# Patient Record
Sex: Female | Born: 1945
Health system: Southern US, Community
[De-identification: ages and names within clinical notes are randomized; demographics above are authoritative.]

## PROBLEM LIST (undated history)

## (undated) DIAGNOSIS — I10 Essential (primary) hypertension: Secondary | ICD-10-CM

---

## 2015-10-19 ENCOUNTER — Emergency Department (HOSPITAL_BASED_OUTPATIENT_CLINIC_OR_DEPARTMENT_OTHER)
Admission: EM | Admit: 2015-10-19 | Discharge: 2015-10-19 | Disposition: A | Discharge status: Home / Self Care | Payer: BLUE CROSS/BLUE SHIELD | Attending: Emergency Medicine | Admitting: Emergency Medicine

## 2015-10-19 ENCOUNTER — Emergency Department (HOSPITAL_BASED_OUTPATIENT_CLINIC_OR_DEPARTMENT_OTHER): Payer: BLUE CROSS/BLUE SHIELD

## 2015-10-19 ENCOUNTER — Encounter (HOSPITAL_BASED_OUTPATIENT_CLINIC_OR_DEPARTMENT_OTHER): Payer: Self-pay | Admitting: *Deleted

## 2015-10-19 DIAGNOSIS — Z79899 Other long term (current) drug therapy: Secondary | ICD-10-CM | POA: Diagnosis not present

## 2015-10-19 DIAGNOSIS — Y9389 Activity, other specified: Secondary | ICD-10-CM | POA: Diagnosis not present

## 2015-10-19 DIAGNOSIS — S0993XA Unspecified injury of face, initial encounter: Secondary | ICD-10-CM | POA: Diagnosis not present

## 2015-10-19 DIAGNOSIS — I10 Essential (primary) hypertension: Secondary | ICD-10-CM | POA: Insufficient documentation

## 2015-10-19 DIAGNOSIS — S4991XA Unspecified injury of right shoulder and upper arm, initial encounter: Secondary | ICD-10-CM | POA: Diagnosis present

## 2015-10-19 DIAGNOSIS — S59901A Unspecified injury of right elbow, initial encounter: Secondary | ICD-10-CM | POA: Diagnosis not present

## 2015-10-19 DIAGNOSIS — S6992XA Unspecified injury of left wrist, hand and finger(s), initial encounter: Secondary | ICD-10-CM | POA: Insufficient documentation

## 2015-10-19 DIAGNOSIS — S6991XA Unspecified injury of right wrist, hand and finger(s), initial encounter: Secondary | ICD-10-CM | POA: Diagnosis not present

## 2015-10-19 DIAGNOSIS — Y92410 Unspecified street and highway as the place of occurrence of the external cause: Secondary | ICD-10-CM | POA: Diagnosis not present

## 2015-10-19 DIAGNOSIS — Y998 Other external cause status: Secondary | ICD-10-CM | POA: Insufficient documentation

## 2015-10-19 DIAGNOSIS — M25511 Pain in right shoulder: Secondary | ICD-10-CM

## 2015-10-19 DIAGNOSIS — S59902A Unspecified injury of left elbow, initial encounter: Secondary | ICD-10-CM | POA: Diagnosis not present

## 2015-10-19 HISTORY — DX: Essential (primary) hypertension: I10

## 2015-10-19 MED ORDER — IBUPROFEN 400 MG PO TABS
400.0000 mg | ORAL_TABLET | Freq: Once | ORAL | Status: AC
  Administered 2015-10-19: 400 mg via ORAL
  Filled 2015-10-19: qty 1

## 2015-10-19 MED ORDER — IBUPROFEN 400 MG PO TABS: 400.0000 mg | ORAL_TABLET | Freq: Three times a day (TID) | ORAL | Status: DC

## 2015-10-19 MED ORDER — HYDROCODONE-ACETAMINOPHEN 5-325 MG PO TABS
1.0000 | ORAL_TABLET | Freq: Once | ORAL | Status: AC
  Administered 2015-10-19: 1 via ORAL
  Filled 2015-10-19: qty 1

## 2015-10-19 MED FILL — IBUPROFEN 400 MG TABLET: 400 | 4 days supply | Qty: 12 | Fill #0
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
## 2015-10-19 NOTE — Discharge Instructions (Signed)

## 2015-10-19 NOTE — ED Notes (Addendum)
Given ice pack. Ambulatory to BR without difficulty.
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
## 2019-10-16 ENCOUNTER — Inpatient Hospital Stay (HOSPITAL_BASED_OUTPATIENT_CLINIC_OR_DEPARTMENT_OTHER)
Admission: EM | Admit: 2019-10-16 | Discharge: 2019-10-18 | DRG: 812 | Disposition: A | Discharge status: Home / Self Care | Payer: Medicaid Other | Attending: Internal Medicine | Admitting: Internal Medicine

## 2019-10-16 ENCOUNTER — Other Ambulatory Visit: Payer: Self-pay

## 2019-10-16 ENCOUNTER — Encounter (HOSPITAL_BASED_OUTPATIENT_CLINIC_OR_DEPARTMENT_OTHER): Payer: Self-pay | Admitting: Emergency Medicine

## 2019-10-16 DIAGNOSIS — D649 Anemia, unspecified: Secondary | ICD-10-CM | POA: Diagnosis not present

## 2019-10-16 DIAGNOSIS — I10 Essential (primary) hypertension: Secondary | ICD-10-CM | POA: Diagnosis not present

## 2019-10-16 DIAGNOSIS — Z20822 Contact with and (suspected) exposure to covid-19: Secondary | ICD-10-CM | POA: Diagnosis present

## 2019-10-16 DIAGNOSIS — D509 Iron deficiency anemia, unspecified: Principal | ICD-10-CM | POA: Diagnosis present

## 2019-10-16 DIAGNOSIS — Z8249 Family history of ischemic heart disease and other diseases of the circulatory system: Secondary | ICD-10-CM

## 2019-10-16 DIAGNOSIS — Z79899 Other long term (current) drug therapy: Secondary | ICD-10-CM

## 2019-10-16 DIAGNOSIS — N289 Disorder of kidney and ureter, unspecified: Secondary | ICD-10-CM | POA: Diagnosis present

## 2019-10-16 DIAGNOSIS — Z833 Family history of diabetes mellitus: Secondary | ICD-10-CM

## 2019-10-16 DIAGNOSIS — E785 Hyperlipidemia, unspecified: Secondary | ICD-10-CM | POA: Diagnosis present

## 2019-10-16 LAB — COMPREHENSIVE METABOLIC PANEL
ALT: 18 U/L (ref 0–44)
AST: 26 U/L (ref 15–41)
Albumin: 3.3 g/dL — ABNORMAL LOW (ref 3.5–5.0)
Alkaline Phosphatase: 65 U/L (ref 38–126)
Anion gap: 8 (ref 5–15)
BUN: 13 mg/dL (ref 8–23)
CO2: 24 mmol/L (ref 22–32)
Calcium: 8.5 mg/dL — ABNORMAL LOW (ref 8.9–10.3)
Chloride: 102 mmol/L (ref 98–111)
Creatinine, Ser: 1.19 mg/dL — ABNORMAL HIGH (ref 0.44–1.00)
GFR calc Af Amer: 52 mL/min — ABNORMAL LOW (ref >60)
GFR calc non Af Amer: 45 mL/min — ABNORMAL LOW (ref >60)
Glucose, Bld: 134 mg/dL — ABNORMAL HIGH (ref 70–99)
Potassium: 4 mmol/L (ref 3.5–5.1)
Sodium: 134 mmol/L — ABNORMAL LOW (ref 135–145)
Total Bilirubin: 0.4 mg/dL (ref 0.3–1.2)
Total Protein: 6.9 g/dL (ref 6.5–8.1)

## 2019-10-16 LAB — CBC WITH DIFFERENTIAL/PLATELET
Abs Immature Granulocytes: 0.02 10*3/uL (ref 0.00–0.07)
Basophils Absolute: 0 10*3/uL (ref 0.0–0.1)
Basophils Relative: 0 %
Eosinophils Absolute: 0.2 10*3/uL (ref 0.0–0.5)
Eosinophils Relative: 2 %
HCT: 17.5 % — ABNORMAL LOW (ref 36.0–46.0)
Hemoglobin: 5.1 g/dL — CL (ref 12.0–15.0)
Immature Granulocytes: 0 %
Lymphocytes Relative: 21 %
Lymphs Abs: 1.5 10*3/uL (ref 0.7–4.0)
MCH: 22.6 pg — ABNORMAL LOW (ref 26.0–34.0)
MCHC: 29.1 g/dL — ABNORMAL LOW (ref 30.0–36.0)
MCV: 77.4 fL — ABNORMAL LOW (ref 80.0–100.0)
Monocytes Absolute: 0.9 10*3/uL (ref 0.1–1.0)
Monocytes Relative: 13 %
Neutro Abs: 4.3 10*3/uL (ref 1.7–7.7)
Neutrophils Relative %: 64 %
Platelets: 335 10*3/uL (ref 150–400)
RBC: 2.26 MIL/uL — ABNORMAL LOW (ref 3.87–5.11)
RDW: 17.2 % — ABNORMAL HIGH (ref 11.5–15.5)
WBC: 6.8 10*3/uL (ref 4.0–10.5)
nRBC: 1.5 % — ABNORMAL HIGH (ref 0.0–0.2)

## 2019-10-16 LAB — OCCULT BLOOD X 1 CARD TO LAB, STOOL: Fecal Occult Bld: NEGATIVE

## 2019-10-16 NOTE — ED Triage Notes (Signed)
Pt arrives with hemoglobin of 5, labs drawn by PCP today. Results were called to her tonight, states she needs emergency blood transfusion. Pt reports feeling her heart racing, generalized weakness.

## 2019-10-16 NOTE — ED Notes (Signed)
Date and time results received: 10/16/19 2307  Test: hemoglobin Critical Value: 5.1  Name of Provider Notified: Dr. Alvino Chapel  Orders Received? Or Actions Taken?: no new orders

## 2019-10-16 NOTE — ED Notes (Signed)
Pt and family educated that pt will need to be transferred to another facility to receive blood transfusion, verbalized understanding.

## 2019-10-16 NOTE — ED Provider Notes (Signed)
[facility] EMERGENCY DEPARTMENT Provider Note   CSN: [id number] Arrival date & time: 10/16/19  2205     History Chief Complaint  Patient presents with  . Abnormal Lab    low hemoglobin - sent for blood transfusion    Lori Rush is a 74 y.o. female.  HPI Patient reportedly sent in by PCP for blood transfusion for anemia of hemoglobin of 5.  Had blood work drawn because she is been feeling shortness of breath and fatigue.  States has had a for around 6 days.  No chest pain.  No blood in the stool.  No bleeding.  No history of anemia.  Not on blood thinners.  No weight loss.  No abdominal pain.  Sometimes will feel her heart race.    Past Medical History:  Diagnosis Date  . Hypertension     There are no problems to display for this patient.   History reviewed. No pertinent surgical history.   OB History   No obstetric history on file.     Family History  Problem Relation Age of Onset  . Diabetes Mother     Social History   Tobacco Use  . Smoking status: Never Smoker  Substance Use Topics  . Alcohol use: No  . Drug use: No    Home Medications Prior to Admission medications   Medication Sig Start Date End Date Taking? Authorizing Provider  diltiazem (CARDIZEM) 120 MG tablet Take 120 mg by mouth 2 (two) times daily at 10 AM and 5 PM.    [provider]  ibuprofen (ADVIL,MOTRIN) 400 MG tablet Take 1 tablet (400 mg total) by mouth 3 (three) times daily. 10/19/15   [name], MD  metoprolol (LOPRESSOR) 100 MG tablet Take 100 mg by mouth 2 (two) times daily.    [provider]    Allergies    Patient has no known allergies.  Review of Systems   Review of Systems  Constitutional: Positive for fatigue. Negative for appetite change.  Respiratory: Positive for shortness of breath.   Cardiovascular: Negative for chest pain.  Gastrointestinal: Negative for abdominal pain.  Genitourinary: Negative for flank pain.  Musculoskeletal:  Negative for back pain.  Skin: Negative for rash.  Neurological: Positive for weakness.  Psychiatric/Behavioral: Negative for confusion.    Physical Exam Updated Vital Signs BP (!) 143/64   Pulse 69   Temp 98.4 F (36.9 C) (Oral)   Resp 18   Ht 5\' 3"  (1.6 m)   Wt 62.3 kg   SpO2 97%   BMI 24.34 kg/m   Physical Exam Vitals and nursing note reviewed.  HENT:     Head: Normocephalic.     Mouth/Throat:     Mouth: Mucous membranes are moist.  Eyes:     General: No scleral icterus. Cardiovascular:     Rate and Rhythm: Regular rhythm.  Pulmonary:     Breath sounds: No wheezing.  Abdominal:     Tenderness: There is no abdominal tenderness.  Skin:    Capillary Refill: Capillary refill takes less than 2 seconds.     Coloration: Skin is pale.  Neurological:     Mental Status: She is alert and oriented to person, place, and time. Mental status is at baseline.     ED Results / Procedures / Treatments   Labs (all labs ordered are listed, but only abnormal results are displayed) Labs Reviewed  CBC WITH DIFFERENTIAL/PLATELET - Abnormal; Notable for the following components:  Result Value   RBC 2.26 (*)    Hemoglobin 5.1 (*)    HCT 17.5 (*)    MCV 77.4 (*)    MCH 22.6 (*)    MCHC 29.1 (*)    RDW 17.2 (*)    nRBC 1.5 (*)    All other components within normal limits  OCCULT BLOOD X 1 CARD TO LAB, STOOL  COMPREHENSIVE METABOLIC PANEL  VITAMIN 123456  FOLATE  IRON AND TIBC  FERRITIN  RETICULOCYTES    EKG None  Radiology No results found.  Procedures Procedures (including critical care time)  Medications Ordered in ED Medications - No data to display  ED Course  I have reviewed the triage vital signs and the nursing notes.  Pertinent labs & imaging results that were available during my care of the patient were reviewed by me and considered in my medical decision making (see chart for details).    MDM Rules/Calculators/A&P                      Patient  sent in for anemia.  Reported lab work drawn as an outpatient with hemoglobin of 5 something.  Patient has brown stool on rectal exam.However she is somewhat pale.  If hemoglobin is 5 require admission to the hospital.  However we do not have typed blood here do not think she needs O-.  Patient has come back with a mildly microcytic anemia.  Guaiac negative.  Will discuss with hospitalist about transfer.  Patient request the [facility] system Final Clinical Impression(s) / ED Diagnoses Final diagnoses:  Anemia, unspecified type    Rx / DC Orders ED Discharge Orders    None       Davonna Belling, MD 10/16/19 2312

## 2019-10-17 ENCOUNTER — Encounter (HOSPITAL_COMMUNITY): Payer: Self-pay | Admitting: Family Medicine

## 2019-10-17 DIAGNOSIS — D649 Anemia, unspecified: Secondary | ICD-10-CM | POA: Diagnosis not present

## 2019-10-17 DIAGNOSIS — I10 Essential (primary) hypertension: Secondary | ICD-10-CM | POA: Diagnosis present

## 2019-10-17 DIAGNOSIS — D509 Iron deficiency anemia, unspecified: Secondary | ICD-10-CM | POA: Diagnosis present

## 2019-10-17 DIAGNOSIS — Z79899 Other long term (current) drug therapy: Secondary | ICD-10-CM | POA: Diagnosis not present

## 2019-10-17 DIAGNOSIS — Z833 Family history of diabetes mellitus: Secondary | ICD-10-CM | POA: Diagnosis not present

## 2019-10-17 DIAGNOSIS — Z8249 Family history of ischemic heart disease and other diseases of the circulatory system: Secondary | ICD-10-CM | POA: Diagnosis not present

## 2019-10-17 DIAGNOSIS — Z20822 Contact with and (suspected) exposure to covid-19: Secondary | ICD-10-CM | POA: Diagnosis present

## 2019-10-17 DIAGNOSIS — R718 Other abnormality of red blood cells: Secondary | ICD-10-CM | POA: Diagnosis present

## 2019-10-17 DIAGNOSIS — E785 Hyperlipidemia, unspecified: Secondary | ICD-10-CM | POA: Diagnosis present

## 2019-10-17 DIAGNOSIS — N289 Disorder of kidney and ureter, unspecified: Secondary | ICD-10-CM

## 2019-10-17 LAB — PREPARE RBC (CROSSMATCH)

## 2019-10-17 LAB — IRON AND TIBC
Iron: 8 ug/dL — ABNORMAL LOW (ref 28–170)
Saturation Ratios: 2 % — ABNORMAL LOW (ref 10.4–31.8)
TIBC: 456 ug/dL — ABNORMAL HIGH (ref 250–450)
UIBC: 448 ug/dL

## 2019-10-17 LAB — VITAMIN B12: Vitamin B-12: 650 pg/mL (ref 180–914)

## 2019-10-17 LAB — FOLATE: Folate: 17.6 ng/mL (ref >5.9)

## 2019-10-17 LAB — RETICULOCYTES
Immature Retic Fract: 21.2 % — ABNORMAL HIGH (ref 2.3–15.9)
RBC.: 2.28 MIL/uL — ABNORMAL LOW (ref 3.87–5.11)
Retic Count, Absolute: 129.7 10*3/uL (ref 19.0–186.0)
Retic Ct Pct: 5.8 % — ABNORMAL HIGH (ref 0.4–3.1)

## 2019-10-17 LAB — HEMOGLOBIN AND HEMATOCRIT, BLOOD
HCT: 27.6 % — ABNORMAL LOW (ref 36.0–46.0)
Hemoglobin: 8.6 g/dL — ABNORMAL LOW (ref 12.0–15.0)

## 2019-10-17 LAB — ABO/RH: ABO/RH(D): B POS

## 2019-10-17 LAB — SARS CORONAVIRUS 2 (TAT 6-24 HRS): SARS Coronavirus 2: NEGATIVE

## 2019-10-17 LAB — FERRITIN: Ferritin: 4 ng/mL — ABNORMAL LOW (ref 11–307)

## 2019-10-17 MED ORDER — ACETAMINOPHEN 650 MG RE SUPP: 650.0000 mg | Freq: Four times a day (QID) | RECTAL | Status: DC | PRN

## 2019-10-17 MED ORDER — AMLODIPINE BESYLATE 5 MG PO TABS
5.0000 mg | ORAL_TABLET | Freq: Every day | ORAL | Status: DC
  Administered 2019-10-17 – 2019-10-18 (×2): 5 mg via ORAL
  Filled 2019-10-17 (×2): qty 1

## 2019-10-17 MED ORDER — ATORVASTATIN CALCIUM 20 MG PO TABS
20.0000 mg | ORAL_TABLET | Freq: Every day | ORAL | Status: DC
  Administered 2019-10-17: 20 mg via ORAL
  Filled 2019-10-17: qty 1

## 2019-10-17 MED ORDER — PANTOPRAZOLE SODIUM 40 MG PO TBEC
40.0000 mg | DELAYED_RELEASE_TABLET | Freq: Every day | ORAL | Status: DC
  Administered 2019-10-18: 40 mg via ORAL
  Filled 2019-10-17: qty 1

## 2019-10-17 MED ORDER — SODIUM CHLORIDE 0.9 % IV SOLN: INTRAVENOUS | Status: AC

## 2019-10-17 MED ORDER — ONDANSETRON HCL 4 MG PO TABS: 4.0000 mg | ORAL_TABLET | Freq: Four times a day (QID) | ORAL | Status: DC | PRN

## 2019-10-17 MED ORDER — SODIUM CHLORIDE 0.9% FLUSH
3.0000 mL | Freq: Two times a day (BID) | INTRAVENOUS | Status: DC
  Administered 2019-10-17 – 2019-10-18 (×2): 3 mL via INTRAVENOUS

## 2019-10-17 MED ORDER — PANTOPRAZOLE SODIUM 40 MG IV SOLR
40.0000 mg | INTRAVENOUS | Status: DC
  Administered 2019-10-17: 40 mg via INTRAVENOUS
  Filled 2019-10-17: qty 40

## 2019-10-17 MED ORDER — SODIUM CHLORIDE 0.9% IV SOLUTION: Freq: Once | INTRAVENOUS | Status: AC

## 2019-10-17 MED ORDER — METOPROLOL TARTRATE 50 MG PO TABS
100.0000 mg | ORAL_TABLET | Freq: Two times a day (BID) | ORAL | Status: DC
  Administered 2019-10-17 – 2019-10-18 (×3): 100 mg via ORAL
  Filled 2019-10-17 (×3): qty 2

## 2019-10-17 MED ORDER — ONDANSETRON HCL 4 MG/2ML IJ SOLN: 4.0000 mg | Freq: Four times a day (QID) | INTRAMUSCULAR | Status: DC | PRN

## 2019-10-17 MED ORDER — ACETAMINOPHEN 325 MG PO TABS: 650.0000 mg | ORAL_TABLET | Freq: Four times a day (QID) | ORAL | Status: DC | PRN

## 2019-10-17 NOTE — ED Notes (Signed)
Attempted report 

## 2019-10-17 NOTE — Progress Notes (Addendum)
PROGRESS NOTE  Lori Rush [id number] DOB: 07/29/1946 DOA: 10/16/2019 PCP: Glendon Axe, MD   LOS: 0 days   Brief narrative: As per HPI,  Lori Rush is a 74 y.o. female with medical history significant for hypertension, GERD to the hospital with fatigue and dyspnea on exertion for last 5 days.  Patient did have some dark stools but no frank blood.  No history of NSAID use alcohol usage.  Patient has not had any colonoscopy or endoscopy in the past.  In the ED at [facility], patient was found to be afebrile, saturating well on room air, and with stable blood pressure.  Chemistry panel notable for creatinine 1.19 with no prior labs available.  CBC features a hemoglobin of 5.1, MCV 77.4, and normal platelets.  Fecal occult blood testing was negative.  COVID-19 was negative. Patient was transferred to [facility] for further evaluation and management  Assessment/Plan:  Principal Problem:   Symptomatic anemia Active Problems:   Hypertension   Mild renal insufficiency  Symptomatic severe iron deficiency anemia with fatigue, dyspnea on exertion.  Hemoglobin of 5.1 on presentation.  Patient is currently receiving second unit of blood transfusion.  We will check hemoglobin after transfusion.    Will need to have a PT evaluate the patient prior to discharge due to fatigue and dyspnea on exertion.  Repeat a stool for FOBT.  COVID-19 was negative.  Ferritin extremely low at 4.  TIBC is elevated and serum iron is 8.  Patient would benefit from a iron transfusion in a.m.  Need iron supplements on discharge.  Patient will need to follow-up with primary care physician regarding endoscopy colonoscopy evaluation.  Folate and vitamin B12 was within normal limits.  Patient was on Motrin at home will need to discontinue on discharge.  Add Protonix empirically for now.  Essential hypertension.   on amlodipine, losartan and metoprolol at home.  Will continue to monitor.  Blood pressure  seems to be stable.  Hyperlipidemia.  Continue Lipitor.  Mild renal insufficiency.  No baseline creatinine levels to compare.  Closely monitor.  VTE Prophylaxis: SCD due to significant anemia  Code Status: Full code  Family Communication: I tried to call the patient's sister on the phone listed but was unable to reach her..  Disposition Plan:  . Patient is from home . Likely disposition to home likely in 1 to 2 days.   . Barriers to discharge: Ongoing blood transfusion today, will likely need iron transfusion in a.m. as well, , will need PT evaluation prior to safe discharge due to dyspnea on exertion and fatigue/advanced age..  Will change the patient's status to inpatient at this time.   Consultants:  None.  Procedures:  PRBC transfusion 2 units  Antibiotics:  . None  Anti-infectives (From admission, onward)   None      Subjective: Today, patient was seen and examined at bedside.  Denies chest pain, dizziness, shortness of breath at rest.  Objective: Vitals:   10/17/19 1538 10/17/19 1620  BP: 132/70 119/67  Pulse: 60 60  Resp: 16   Temp: 99 F (37.2 C) 98.7 F (37.1 C)  SpO2: 98% 97%    Intake/Output Summary (Last 24 hours) at 10/17/2019 1805 Last data filed at 10/17/2019 1329 Gross per 24 hour  Intake 1318 ml  Output --  Net 1318 ml   Filed Weights   10/16/19 2211  Weight: 62.3 kg   Body mass index is 24.34 kg/m.   Physical Exam: GENERAL:  Patient is alert awake and oriented. Not in obvious distress. HENT: Pallor noted. Pupils equally reactive to light. Oral mucosa is moist NECK: is supple, no gross swelling noted. CHEST: Clear to auscultation. No crackles or wheezes.  Diminished breath sounds bilaterally. CVS: S1 and S2 heard, no murmur. Regular rate and rhythm.  ABDOMEN: Soft, non-tender, bowel sounds are present. EXTREMITIES: No edema. CNS: Cranial nerves are intact. No focal motor deficits. SKIN: warm and dry without rashes.  Data Review:  I have personally reviewed the following laboratory data and studies,  CBC: Recent Labs  Lab 10/16/19 2236  WBC 6.8  NEUTROABS 4.3  HGB 5.1*  HCT 17.5*  MCV 77.4*  PLT 123456   Basic Metabolic Panel: Recent Labs  Lab 10/16/19 2236  NA 134*  K 4.0  CL 102  CO2 24  GLUCOSE 134*  BUN 13  CREATININE 1.19*  CALCIUM 8.5*   Liver Function Tests: Recent Labs  Lab 10/16/19 2236  AST 26  ALT 18  ALKPHOS 65  BILITOT 0.4  PROT 6.9  ALBUMIN 3.3*   No results for input(s): LIPASE, AMYLASE in the last 168 hours. No results for input(s): AMMONIA in the last 168 hours. Cardiac Enzymes: No results for input(s): CKTOTAL, CKMB, CKMBINDEX, TROPONINI in the last 168 hours. BNP (last 3 results) No results for input(s): BNP in the last 8760 hours.  ProBNP (last 3 results) No results for input(s): PROBNP in the last 8760 hours.  CBG: No results for input(s): GLUCAP in the last 168 hours. Recent Results (from the past 240 hour(s))  SARS CORONAVIRUS 2 (TAT 6-24 HRS) Nasopharyngeal Nasopharyngeal Swab     Status: None   Collection Time: 10/17/19 12:06 AM   Specimen: Nasopharyngeal Swab  Result Value Ref Range Status   SARS Coronavirus 2 NEGATIVE NEGATIVE Final    Comment: (NOTE) SARS-CoV-2 target nucleic acids are NOT DETECTED. The SARS-CoV-2 RNA is generally detectable in upper and lower respiratory specimens during the acute phase of infection. Negative results do not preclude SARS-CoV-2 infection, do not rule out co-infections with other pathogens, and should not be used as the sole basis for treatment or other patient management decisions. Negative results must be combined with clinical observations, patient history, and epidemiological information. The expected result is Negative. Fact Sheet for Patients: SugarRoll.be Fact Sheet for Healthcare Providers: https://www.woods-mathews.com/ This test is not yet approved or cleared by the  [place] FDA and  has been authorized for detection and/or diagnosis of SARS-CoV-2 by FDA under an Emergency Use Authorization (EUA). This EUA will remain  in effect (meaning this test can be used) for the duration of the COVID-19 declaration under Section 56 4(b)(1) of the Act, 21 U.S.C. section 360bbb-3(b)(1), unless the authorization is terminated or revoked sooner. Performed at [facility], [place] [address] [place], [place] [address]      Studies: No results found.   Flora Lipps, MD  [facility] 10/17/2019

## 2019-10-17 NOTE — H&P (Addendum)
History and Physical    Lori Rush [id number] DOB: [birth year] DOA: 10/16/2019  PCP: Glendon Axe, MD   Patient coming from: Home   Chief Complaint: Fatigue, DOE   HPI: Lori Rush is a 74 y.o. female with medical history significant for hypertension, now presenting to the emergency department for evaluation of fatigue and exertional dyspnea.  The patient reports that she developed fatigue and some exertional dyspnea roughly 5 days ago, had been having some dark stool, nearly black, but not in the past couple days.  She denies any abdominal pain or vomiting.  She denies any history of alcohol use or liver disease, reports occasional Tylenol use, but denies any NSAID use in over a year.  She denies chest pain or lightheadedness.  She has never had this previously.  She has never had endoscopy.  Does not know of anyone in her family with history of GI malignancy.  [name] ED Course: Upon arrival to the ED, patient is found to be afebrile, saturating well on room air, and with stable blood pressure.  Chemistry panel notable for creatinine 1.19 with no prior labs available.  CBC features a hemoglobin of 5.1, MCV 77.4, and normal platelets.  Fecal occult blood testing was negative.  COVID-19 screening test not yet resulted.  Patient was transferred to [facility] for further evaluation and management.  Review of Systems:  All other systems reviewed and apart from HPI, are negative.  Past Medical History:  Diagnosis Date  . Hypertension     History reviewed. No pertinent surgical history.   reports that she has never smoked. She has never used smokeless tobacco. She reports that she does not drink alcohol or use drugs.  No Known Allergies  Family History  Problem Relation Age of Onset  . Diabetes Mother   . Diabetes Father   . Hypertension Father      Prior to Admission medications   Medication Sig Start Date End Date Taking? Authorizing Provider    amLODipine (NORVASC) 5 MG tablet Take 5 mg by mouth daily. 10/08/19  Yes [provider]  atorvastatin (LIPITOR) 20 MG tablet Take 20 mg by mouth at bedtime. 10/11/19  Yes [provider]  losartan (COZAAR) 100 MG tablet Take 100 mg by mouth daily. 08/19/19  Yes [provider]  metoprolol (LOPRESSOR) 100 MG tablet Take 100 mg by mouth 2 (two) times daily.   Yes [provider]  ibuprofen (ADVIL,MOTRIN) 400 MG tablet Take 1 tablet (400 mg total) by mouth 3 (three) times daily. Patient not taking: Reported on 10/17/2019 10/19/15   [name], MD    Physical Exam: Vitals:   10/17/19 0200 10/17/19 0230 10/17/19 0300 10/17/19 0425  BP: (!) 152/63 (!) 149/74 (!) 141/68 (!) 160/68  Pulse: 70 66 71 65  Resp: 18 18  18   Temp:    98.5 F (36.9 C)  TempSrc:    Oral  SpO2: 99% 98% 95% 96%  Weight:      Height:         Constitutional: NAD, calm  Eyes: PERTLA, lids and conjunctivae normal ENMT: Mucous membranes are moist. Posterior pharynx clear of any exudate or lesions.   Neck: normal, supple, no masses, no thyromegaly Respiratory: no wheezing, no crackles. No accessory muscle use.  Cardiovascular: S1 & S2 heard, regular rate and rhythm. No extremity edema.   Abdomen: No distension, no tenderness, soft. Bowel sounds active.  Musculoskeletal: no clubbing / cyanosis. No joint deformity upper  and lower extremities.   Skin: no significant rashes, lesions, ulcers. Warm, dry, well-perfused. Neurologic: no facial asymmetry. Sensation intact. Moving all extremities.  Psychiatric: Alert and oriented, appropriate throughout interview and exam. Very pleasant and cooperative.    Labs and Imaging on Admission: I have personally reviewed following labs and imaging studies  CBC: Recent Labs  Lab 10/16/19 2236  WBC 6.8  NEUTROABS 4.3  HGB 5.1*  HCT 17.5*  MCV 77.4*  PLT 123456   Basic Metabolic Panel: Recent Labs  Lab 10/16/19 2236  NA 134*  K 4.0  CL 102   CO2 24  GLUCOSE 134*  BUN 13  CREATININE 1.19*  CALCIUM 8.5*   GFR: Estimated Creatinine Clearance: 33.8 mL/min (A) (by C-G formula based on SCr of 1.19 mg/dL (H)). Liver Function Tests: Recent Labs  Lab 10/16/19 2236  AST 26  ALT 18  ALKPHOS 65  BILITOT 0.4  PROT 6.9  ALBUMIN 3.3*   No results for input(s): LIPASE, AMYLASE in the last 168 hours. No results for input(s): AMMONIA in the last 168 hours. Coagulation Profile: No results for input(s): INR, PROTIME in the last 168 hours. Cardiac Enzymes: No results for input(s): CKTOTAL, CKMB, CKMBINDEX, TROPONINI in the last 168 hours. BNP (last 3 results) No results for input(s): PROBNP in the last 8760 hours. HbA1C: No results for input(s): HGBA1C in the last 72 hours. CBG: No results for input(s): GLUCAP in the last 168 hours. Lipid Profile: No results for input(s): CHOL, HDL, LDLCALC, TRIG, CHOLHDL, LDLDIRECT in the last 72 hours. Thyroid Function Tests: No results for input(s): TSH, T4TOTAL, FREET4, T3FREE, THYROIDAB in the last 72 hours. Anemia Panel: Recent Labs    10/16/19 2325 10/17/19 0205  VITAMINB12 650  --   FOLATE 17.6  --   FERRITIN 4*  --   TIBC 456*  --   IRON 8*  --   RETICCTPCT  --  5.8*   Urine analysis: No results found for: COLORURINE, APPEARANCEUR, LABSPEC, PHURINE, GLUCOSEU, HGBUR, BILIRUBINUR, KETONESUR, PROTEINUR, UROBILINOGEN, NITRITE, LEUKOCYTESUR Sepsis Labs: @LABRCNTIP (procalcitonin:4,lacticidven:4) )No results found for this or any previous visit (from the past 240 hour(s)).   Radiological Exams on Admission: No results found.  Assessment/Plan   1. Symptomatic anemia  - Presents with several days of fatigue and DOE, reports recent dark stools but none in past couple days, found to have Hgb of 5.1 with FOBT negative, normal BUN  - Type and screen, transfuse 2 units RBC, repeat CBC post-transfusion    2. Hypertension  - BP stable  - Hold losartan in light of renal insufficiency  with unknown baseline and continue Norvasc and metoprolol as tolerated    3. Mild renal insufficiency  - SCr is 1.13 on admission with no prior labs available  - Could be mild prerenal azotemia in setting of blood loss  - Hold losartan initially, transfuse RBC, repeat chem panel tomorrow    DVT prophylaxis: SCDs  Code Status: Full  Family Communication: Discussed with patient  Disposition Plan: Anticipate return home in 1-2 days after transfusion if fatigue and DOE improve and H&H stable  Consults called: None  Admission status: Observation     Vianne Bulls, MD [facility] Pager: See [email or web address]  If 7AM-7PM, please contact the daytime attending [email or web address]  10/17/2019, 5:46 AM

## 2019-10-18 LAB — BPAM RBC
Blood Product Expiration Date: 202104112359
Blood Product Expiration Date: 202104152359
ISSUE DATE / TIME: 202103190906
ISSUE DATE / TIME: 202103191555
Unit Type and Rh: 7300
Unit Type and Rh: 7300

## 2019-10-18 LAB — BASIC METABOLIC PANEL
Anion gap: 7 (ref 5–15)
BUN: 12 mg/dL (ref 8–23)
CO2: 27 mmol/L (ref 22–32)
Calcium: 8.4 mg/dL — ABNORMAL LOW (ref 8.9–10.3)
Chloride: 107 mmol/L (ref 98–111)
Creatinine, Ser: 1.11 mg/dL — ABNORMAL HIGH (ref 0.44–1.00)
GFR calc Af Amer: 56 mL/min — ABNORMAL LOW (ref >60)
GFR calc non Af Amer: 49 mL/min — ABNORMAL LOW (ref >60)
Glucose, Bld: 106 mg/dL — ABNORMAL HIGH (ref 70–99)
Potassium: 3.7 mmol/L (ref 3.5–5.1)
Sodium: 141 mmol/L (ref 135–145)

## 2019-10-18 LAB — TYPE AND SCREEN
ABO/RH(D): B POS
Antibody Screen: NEGATIVE
Unit division: 0
Unit division: 0

## 2019-10-18 LAB — CBC
HCT: 28.6 % — ABNORMAL LOW (ref 36.0–46.0)
Hemoglobin: 8.9 g/dL — ABNORMAL LOW (ref 12.0–15.0)
MCH: 26.2 pg (ref 26.0–34.0)
MCHC: 31.1 g/dL (ref 30.0–36.0)
MCV: 84.1 fL (ref 80.0–100.0)
Platelets: 287 10*3/uL (ref 150–400)
RBC: 3.4 MIL/uL — ABNORMAL LOW (ref 3.87–5.11)
RDW: 18.9 % — ABNORMAL HIGH (ref 11.5–15.5)
WBC: 7 10*3/uL (ref 4.0–10.5)
nRBC: 0.4 % — ABNORMAL HIGH (ref 0.0–0.2)

## 2019-10-18 LAB — MAGNESIUM: Magnesium: 2.4 mg/dL (ref 1.7–2.4)

## 2019-10-18 MED ORDER — ONDANSETRON HCL 4 MG PO TABS: 4.0000 mg | ORAL_TABLET | Freq: Four times a day (QID) | ORAL | 0 refills | Status: DC | PRN

## 2019-10-18 MED ORDER — VITAMIN C 250 MG PO TABS: 250.0000 mg | ORAL_TABLET | Freq: Two times a day (BID) | ORAL | Status: AC

## 2019-10-18 MED ORDER — DOCUSATE SODIUM 100 MG PO CAPS: 200.0000 mg | ORAL_CAPSULE | Freq: Every day | ORAL | 2 refills | Status: DC

## 2019-10-18 MED ORDER — SODIUM CHLORIDE 0.9 % IV SOLN
510.0000 mg | Freq: Once | INTRAVENOUS | Status: AC
  Administered 2019-10-18: 510 mg via INTRAVENOUS
  Filled 2019-10-18: qty 510

## 2019-10-18 MED ORDER — IRON (FERROUS SULFATE) 325 (65 FE) MG PO TABS: 325.0000 mg | ORAL_TABLET | Freq: Two times a day (BID) | ORAL | Status: DC

## 2019-10-18 NOTE — Discharge Summary (Signed)
Physician Discharge Summary  Quynh Ruzicka [id number] DOB: 15-Jun-1946 DOA: 10/16/2019  PCP: Glendon Axe, MD  Admit date: 10/16/2019 Discharge date: 10/18/2019  Admitted From: Home Disposition: Home Recommendations for Outpatient Follow-up:  1. Follow up with PCP in 1-2 weeks 2. Please obtain BMP/CBC in one week  Home Health: None Equipment/Devices: None Discharge Condition: Stable and improved CODE STATUS: Full code  diet recommendation: Cardiac diet Brief/Interim Summary:74 y.o.femalewith medical history significant forhypertension, GERD to the hospital with fatigue and dyspnea on exertion for last 5 days.  Patient did have some dark stools but no frank blood.  No history of NSAID use alcohol usage.  Patient has not had any colonoscopy or endoscopy in the past.  In the ED at [facility], patient was found to be afebrile, saturating well on room air, and with stable blood pressure. Chemistry panel notable for creatinine 1.19 with no prior labs available. CBC features a hemoglobin of 5.1, MCV 77.4, and normal platelets. Fecal occult blood testing was negative. COVID-19 was negative.Patient was transferred to [facility] for further evaluation and management  Discharge Diagnoses:  Principal Problem:   Symptomatic anemia Active Problems:   Hypertension   Mild renal insufficiency   Symptomatic severe iron deficiency anemia with fatigue, dyspnea on exertion.  Hemoglobin of 5.1 on presentation.  Patient received 2 units of packed RBCs and iron transfusion during the hospital stay.  Hemoglobin after the transfusion is 8.6.  She will be discharged home today on iron supplements and to have GI outpatient follow-up for possible GI work-up for anemia. stool for FOBT negative.  COVID-19 was negative.  Ferritin extremely low at 4.  TIBC is elevated and serum iron is 8. Patient will need to follow-up with primary care physician regarding endoscopy colonoscopy evaluation.   Folate and vitamin B12 was within normal limits.  Patient was on Motrin at home will need to discontinue on discharge.  Essential hypertension.   on amlodipine, losartan and metoprolol at home.   Hyperlipidemia.  Continue Lipitor.  Mild renal insufficiency.    Creatinine 1.11 on the day of discharge.    Estimated body mass index is 24.34 kg/m as calculated from the following:   Height as of this encounter: 5\' 3"  (1.6 m).   Weight as of this encounter: 62.3 kg.  Discharge Instructions   Allergies as of 10/18/2019   No Known Allergies     Medication List    STOP taking these medications   ibuprofen 400 MG tablet Commonly known as: ADVIL     TAKE these medications   amLODipine 5 MG tablet Commonly known as: NORVASC Take 5 mg by mouth daily.   atorvastatin 20 MG tablet Commonly known as: LIPITOR Take 20 mg by mouth at bedtime.   losartan 100 MG tablet Commonly known as: COZAAR Take 100 mg by mouth daily.   metoprolol tartrate 100 MG tablet Commonly known as: LOPRESSOR Take 100 mg by mouth 2 (two) times daily.   ondansetron 4 MG tablet Commonly known as: ZOFRAN Take 1 tablet (4 mg total) by mouth every 6 (six) hours as needed for nausea.      Follow-up Information    Glendon Axe, MD Follow up.   Specialty: Family Medicine Why: Please refer patient for GI work-up due to severe iron deficiency anemia Contact information: [place] [phone]          No Known Allergies  Consultations:  None   Procedures/Studies:  No results  found. (Echo, Carotid, EGD, Colonoscopy, ERCP)    Subjective: Patient resting in bed anxious to go home denies any shortness of breath or chest pain  Discharge Exam: Vitals:   10/17/19 2055 10/18/19 0413  BP: (!) 105/56 (!) 143/66  Pulse: 70   Resp: 19 18  Temp: 98.9 F (37.2 C) 98.2 F (36.8 C)  SpO2: 98%    Vitals:   10/17/19 1620 10/17/19 1925 10/17/19 2055 10/18/19 0413  BP:  119/67 (!) 106/55 (!) 105/56 (!) 143/66  Pulse: 60 69 70   Resp:  19 19 18   Temp: 98.7 F (37.1 C) 98.1 F (36.7 C) 98.9 F (37.2 C) 98.2 F (36.8 C)  TempSrc: Oral Oral Oral Oral  SpO2: 97% 99% 98%   Weight:      Height:        General: Pt is alert, awake, not in acute distress Cardiovascular: RRR, S1/S2 +, no rubs, no gallops Respiratory: CTA bilaterally, no wheezing, no rhonchi Abdominal: Soft, NT, ND, bowel sounds + Extremities: no edema, no cyanosis    The results of significant diagnostics from this hospitalization (including imaging, microbiology, ancillary and laboratory) are listed below for reference.     Microbiology: Recent Results (from the past 240 hour(s))  SARS CORONAVIRUS 2 (TAT 6-24 HRS) Nasopharyngeal Nasopharyngeal Swab     Status: None   Collection Time: 10/17/19 12:06 AM   Specimen: Nasopharyngeal Swab  Result Value Ref Range Status   SARS Coronavirus 2 NEGATIVE NEGATIVE Final    Comment: (NOTE) SARS-CoV-2 target nucleic acids are NOT DETECTED. The SARS-CoV-2 RNA is generally detectable in upper and lower respiratory specimens during the acute phase of infection. Negative results do not preclude SARS-CoV-2 infection, do not rule out co-infections with other pathogens, and should not be used as the sole basis for treatment or other patient management decisions. Negative results must be combined with clinical observations, patient history, and epidemiological information. The expected result is Negative. Fact Sheet for Patients: SugarRoll.be Fact Sheet for Healthcare Providers: https://www.woods-Anaiz Qazi.com/ This test is not yet approved or cleared by the [place] FDA and  has been authorized for detection and/or diagnosis of SARS-CoV-2 by FDA under an Emergency Use Authorization (EUA). This EUA will remain  in effect (meaning this test can be used) for the duration of the COVID-19 declaration under  Section 56 4(b)(1) of the Act, 21 U.S.C. section 360bbb-3(b)(1), unless the authorization is terminated or revoked sooner. Performed at [facility], [place] [address], [place], [place] [address]      Labs: BNP (last 3 results) No results for input(s): BNP in the last 8760 hours. Basic Metabolic Panel: Recent Labs  Lab 10/16/19 2236 10/18/19 0432  NA 134* 141  K 4.0 3.7  CL 102 107  CO2 24 27  GLUCOSE 134* 106*  BUN 13 12  CREATININE 1.19* 1.11*  CALCIUM 8.5* 8.4*  MG  --  2.4   Liver Function Tests: Recent Labs  Lab 10/16/19 2236  AST 26  ALT 18  ALKPHOS 65  BILITOT 0.4  PROT 6.9  ALBUMIN 3.3*   No results for input(s): LIPASE, AMYLASE in the last 168 hours. No results for input(s): AMMONIA in the last 168 hours. CBC: Recent Labs  Lab 10/16/19 2236 10/17/19 2307 10/18/19 0432  WBC 6.8  --  7.0  NEUTROABS 4.3  --   --   HGB 5.1* 8.6* 8.9*  HCT 17.5* 27.6* 28.6*  MCV 77.4*  --  84.1  PLT 335  --  287   Cardiac Enzymes: No results for input(s): CKTOTAL, CKMB, CKMBINDEX, TROPONINI in the last 168 hours. BNP: Invalid input(s): POCBNP CBG: No results for input(s): GLUCAP in the last 168 hours. D-Dimer No results for input(s): DDIMER in the last 72 hours. Hgb A1c No results for input(s): HGBA1C in the last 72 hours. Lipid Profile No results for input(s): CHOL, HDL, LDLCALC, TRIG, CHOLHDL, LDLDIRECT in the last 72 hours. Thyroid function studies No results for input(s): TSH, T4TOTAL, T3FREE, THYROIDAB in the last 72 hours.  Invalid input(s): FREET3 Anemia work up [employer]    10/16/19 2325 10/17/19 0205  VITAMINB12 650  --   FOLATE 17.6  --   FERRITIN 4*  --   TIBC 456*  --   IRON 8*  --   RETICCTPCT  --  5.8*   Urinalysis No results found for: COLORURINE, APPEARANCEUR, LABSPEC, [place], GLUCOSEU, HGBUR, BILIRUBINUR, KETONESUR, PROTEINUR, UROBILINOGEN, NITRITE, LEUKOCYTESUR Sepsis Labs Invalid input(s): PROCALCITONIN,  WBC,   LACTICIDVEN Microbiology Recent Results (from the past 240 hour(s))  SARS CORONAVIRUS 2 (TAT 6-24 HRS) Nasopharyngeal Nasopharyngeal Swab     Status: None   Collection Time: 10/17/19 12:06 AM   Specimen: Nasopharyngeal Swab  Result Value Ref Range Status   SARS Coronavirus 2 NEGATIVE NEGATIVE Final    Comment: (NOTE) SARS-CoV-2 target nucleic acids are NOT DETECTED. The SARS-CoV-2 RNA is generally detectable in upper and lower respiratory specimens during the acute phase of infection. Negative results do not preclude SARS-CoV-2 infection, do not rule out co-infections with other pathogens, and should not be used as the sole basis for treatment or other patient management decisions. Negative results must be combined with clinical observations, patient history, and epidemiological information. The expected result is Negative. Fact Sheet for Patients: SugarRoll.be Fact Sheet for Healthcare Providers: https://www.woods-Tamantha Saline.com/ This test is not yet approved or cleared by the [place] FDA and  has been authorized for detection and/or diagnosis of SARS-CoV-2 by FDA under an Emergency Use Authorization (EUA). This EUA will remain  in effect (meaning this test can be used) for the duration of the COVID-19 declaration under Section 56 4(b)(1) of the Act, 21 U.S.C. section 360bbb-3(b)(1), unless the authorization is terminated or revoked sooner. Performed at [facility], [place] [address] [place], [place] [address]      Time coordinating discharge:  37 minutes  SIGNED:   Georgette Shell, MD  [facility] 10/18/2019, 8:51 AM Pager   If 7PM-7AM, please contact night-coverage [email or web address] Password [email or web address]

## 2019-10-18 NOTE — Evaluation (Signed)
Physical Therapy Evaluation/Discharge Patient Details Name: Lori Rush MRN: [id number] DOB: [birth year] Today's Date: 10/18/2019   History of Present Illness  74 y.o. female admitted on 10/16/19 forsevere symptomatic iron deficient anemia with fatigue, DOE.  Hgb was 5.1 on presentation to the ED, pt s/p 2 units of PRBCs and iron transfusion.  Pt with significant PMH of HTN.    Clinical Impression  Pt is independent with all mobility including a flight of stairs. She has no reports of weakness, lightheadedness, or DOE with gait.  She feels she is stronger than her most recent baseline.  PT to sign off as she has no acute PT or follow up PT needs.     Follow Up Recommendations No PT follow up    Equipment Recommendations  None recommended by PT    Recommendations for Other Services   NA    Precautions / Restrictions Precautions Precautions: None      Mobility  Bed Mobility Overal bed mobility: Independent                Transfers Overall transfer level: Independent                  Ambulation/Gait Ambulation/Gait assistance: Independent Gait Distance (Feet): 400 Feet Assistive device: None Gait Pattern/deviations: WFL(Within Functional Limits)   Gait velocity interpretation: >2.62 ft/sec, indicative of community ambulatory General Gait Details: good speed, no reports of lightheadedness, weakness, or DOE  Stairs Stairs: Yes Stairs assistance: Independent Stair Management: No rails;Alternating pattern;Forwards Number of Stairs: 10 General stair comments: pt able to preform stairs reciprocally up and down and without rail         Balance Overall balance assessment: No apparent balance deficits (not formally assessed)                                           Pertinent Vitals/Pain Pain Assessment: No/denies pain    Home Living Family/patient expects to be discharged to:: Private residence Living Arrangements: Other  relatives Available Help at Discharge: Family Type of Home: House       Home Layout: Two level;Bed/bath upstairs Home Equipment: None Additional Comments: pt reports she is unemployed, she used to work for [employer].     Prior Function Level of Independence: Independent               Hand Dominance   Dominant Hand: Right    Extremity/Trunk Assessment   Upper Extremity Assessment Upper Extremity Assessment: Overall WFL for tasks assessed    Lower Extremity Assessment Lower Extremity Assessment: Overall WFL for tasks assessed    Cervical / Trunk Assessment Cervical / Trunk Assessment: Normal  Communication   Communication: No difficulties;Other (comment)(broken english)  Cognition Arousal/Alertness: Awake/alert Behavior During Therapy: WFL for tasks assessed/performed Overall Cognitive Status: Within Functional Limits for tasks assessed                                               Assessment/Plan    PT Assessment Patent does not need any further PT services         PT Goals (Current goals can be found in the Care Plan section)  Acute Rehab PT Goals Patient Stated Goal: to go home later today PT Goal Formulation:  All assessment and education complete, DC therapy     AM-PAC PT "6 Clicks" Mobility  Outcome Measure Help needed turning from your back to your side while in a flat bed without using bedrails?: None Help needed moving from lying on your back to sitting on the side of a flat bed without using bedrails?: None Help needed moving to and from a bed to a chair (including a wheelchair)?: None Help needed standing up from a chair using your arms (e.g., wheelchair or bedside chair)?: None Help needed to walk in hospital room?: None Help needed climbing 3-5 steps with a railing? : None 6 Click Score: 24    End of Session   Activity Tolerance: Patient tolerated treatment well Patient left: in bed;with call bell/phone  within reach Nurse Communication: Mobility status PT Visit Diagnosis: Muscle weakness (generalized) (M62.81)       PT Time Calculation  PT Start Time (ACUTE ONLY) 1448  PT Stop Time (ACUTE ONLY) 1458  PT Time Calculation (min) (ACUTE ONLY) 10 min  PT General Charges  $$ ACUTE PT VISIT 1 Visit  PT Evaluation  $PT Eval Moderate Complexity 1 Mod   Verdene Lennert, PT, DPT  Acute Rehabilitation [phone] pager [phone]) [phone] office

## 2020-01-22 ENCOUNTER — Encounter (HOSPITAL_COMMUNITY): Payer: Self-pay | Admitting: Emergency Medicine

## 2020-01-22 ENCOUNTER — Other Ambulatory Visit: Payer: Self-pay

## 2020-01-22 ENCOUNTER — Inpatient Hospital Stay (HOSPITAL_COMMUNITY)
Admit: 2020-01-22 | Discharge: 2020-01-26 | DRG: 378 | Disposition: A | Discharge status: Home / Self Care | Payer: Medicaid Other | Attending: Family Medicine | Admitting: Family Medicine

## 2020-01-22 DIAGNOSIS — D509 Iron deficiency anemia, unspecified: Secondary | ICD-10-CM | POA: Diagnosis present

## 2020-01-22 DIAGNOSIS — Z20822 Contact with and (suspected) exposure to covid-19: Secondary | ICD-10-CM | POA: Diagnosis present

## 2020-01-22 DIAGNOSIS — E785 Hyperlipidemia, unspecified: Secondary | ICD-10-CM | POA: Diagnosis present

## 2020-01-22 DIAGNOSIS — I10 Essential (primary) hypertension: Secondary | ICD-10-CM | POA: Diagnosis not present

## 2020-01-22 DIAGNOSIS — Z8249 Family history of ischemic heart disease and other diseases of the circulatory system: Secondary | ICD-10-CM | POA: Diagnosis not present

## 2020-01-22 DIAGNOSIS — Z79899 Other long term (current) drug therapy: Secondary | ICD-10-CM

## 2020-01-22 DIAGNOSIS — N1831 Chronic kidney disease, stage 3a: Secondary | ICD-10-CM | POA: Diagnosis present

## 2020-01-22 DIAGNOSIS — N182 Chronic kidney disease, stage 2 (mild): Secondary | ICD-10-CM | POA: Diagnosis present

## 2020-01-22 DIAGNOSIS — B9681 Helicobacter pylori [H. pylori] as the cause of diseases classified elsewhere: Secondary | ICD-10-CM | POA: Diagnosis present

## 2020-01-22 DIAGNOSIS — D649 Anemia, unspecified: Secondary | ICD-10-CM | POA: Diagnosis not present

## 2020-01-22 DIAGNOSIS — K5791 Diverticulosis of intestine, part unspecified, without perforation or abscess with bleeding: Secondary | ICD-10-CM | POA: Diagnosis present

## 2020-01-22 DIAGNOSIS — R195 Other fecal abnormalities: Secondary | ICD-10-CM | POA: Diagnosis not present

## 2020-01-22 DIAGNOSIS — A048 Other specified bacterial intestinal infections: Secondary | ICD-10-CM | POA: Diagnosis not present

## 2020-01-22 DIAGNOSIS — K2971 Gastritis, unspecified, with bleeding: Secondary | ICD-10-CM | POA: Diagnosis present

## 2020-01-22 DIAGNOSIS — K922 Gastrointestinal hemorrhage, unspecified: Secondary | ICD-10-CM | POA: Diagnosis present

## 2020-01-22 DIAGNOSIS — D62 Acute posthemorrhagic anemia: Secondary | ICD-10-CM | POA: Diagnosis present

## 2020-01-22 DIAGNOSIS — I129 Hypertensive chronic kidney disease with stage 1 through stage 4 chronic kidney disease, or unspecified chronic kidney disease: Secondary | ICD-10-CM | POA: Diagnosis present

## 2020-01-22 LAB — URINALYSIS, ROUTINE W REFLEX MICROSCOPIC
Bilirubin Urine: NEGATIVE
Glucose, UA: NEGATIVE mg/dL
Hgb urine dipstick: NEGATIVE
Ketones, ur: NEGATIVE mg/dL
Leukocytes,Ua: NEGATIVE
Nitrite: NEGATIVE
Protein, ur: NEGATIVE mg/dL
Specific Gravity, Urine: 1.009 (ref 1.005–1.030)
pH: 6 (ref 5.0–8.0)

## 2020-01-22 LAB — CBC WITH DIFFERENTIAL/PLATELET
Abs Immature Granulocytes: 0.02 10*3/uL (ref 0.00–0.07)
Basophils Absolute: 0 10*3/uL (ref 0.0–0.1)
Basophils Relative: 1 %
Eosinophils Absolute: 0.1 10*3/uL (ref 0.0–0.5)
Eosinophils Relative: 1 %
HCT: 20.7 % — ABNORMAL LOW (ref 36.0–46.0)
Hemoglobin: 6.2 g/dL — CL (ref 12.0–15.0)
Immature Granulocytes: 0 %
Lymphocytes Relative: 32 %
Lymphs Abs: 2.4 10*3/uL (ref 0.7–4.0)
MCH: 25.2 pg — ABNORMAL LOW (ref 26.0–34.0)
MCHC: 30 g/dL (ref 30.0–36.0)
MCV: 84.1 fL (ref 80.0–100.0)
Monocytes Absolute: 0.7 10*3/uL (ref 0.1–1.0)
Monocytes Relative: 9 %
Neutro Abs: 4.3 10*3/uL (ref 1.7–7.7)
Neutrophils Relative %: 57 %
Platelets: 163 10*3/uL (ref 150–400)
RBC: 2.46 MIL/uL — ABNORMAL LOW (ref 3.87–5.11)
RDW: 15.4 % (ref 11.5–15.5)
WBC: 7.4 10*3/uL (ref 4.0–10.5)
nRBC: 0 % (ref 0.0–0.2)

## 2020-01-22 LAB — COMPREHENSIVE METABOLIC PANEL
ALT: 20 U/L (ref 0–44)
AST: 24 U/L (ref 15–41)
Albumin: 3.5 g/dL (ref 3.5–5.0)
Alkaline Phosphatase: 51 U/L (ref 38–126)
Anion gap: 10 (ref 5–15)
BUN: 31 mg/dL — ABNORMAL HIGH (ref 8–23)
CO2: 25 mmol/L (ref 22–32)
Calcium: 8.5 mg/dL — ABNORMAL LOW (ref 8.9–10.3)
Chloride: 103 mmol/L (ref 98–111)
Creatinine, Ser: 1.16 mg/dL — ABNORMAL HIGH (ref 0.44–1.00)
GFR calc Af Amer: 53 mL/min — ABNORMAL LOW (ref >60)
GFR calc non Af Amer: 46 mL/min — ABNORMAL LOW (ref >60)
Glucose, Bld: 139 mg/dL — ABNORMAL HIGH (ref 70–99)
Potassium: 3.3 mmol/L — ABNORMAL LOW (ref 3.5–5.1)
Sodium: 138 mmol/L (ref 135–145)
Total Bilirubin: 0.4 mg/dL (ref 0.3–1.2)
Total Protein: 6 g/dL — ABNORMAL LOW (ref 6.5–8.1)

## 2020-01-22 LAB — FERRITIN: Ferritin: 24 ng/mL (ref 11–307)

## 2020-01-22 LAB — RETICULOCYTES
Immature Retic Fract: 30.3 % — ABNORMAL HIGH (ref 2.3–15.9)
RBC.: 2.42 MIL/uL — ABNORMAL LOW (ref 3.87–5.11)
Retic Count, Absolute: 125.4 10*3/uL (ref 19.0–186.0)
Retic Ct Pct: 5.2 % — ABNORMAL HIGH (ref 0.4–3.1)

## 2020-01-22 LAB — IRON AND TIBC
Iron: 48 ug/dL (ref 28–170)
Saturation Ratios: 13 % (ref 10.4–31.8)
TIBC: 366 ug/dL (ref 250–450)
UIBC: 318 ug/dL

## 2020-01-22 LAB — POC OCCULT BLOOD, ED: Fecal Occult Bld: POSITIVE — AB

## 2020-01-22 LAB — FOLATE: Folate: 11.7 ng/mL (ref >5.9)

## 2020-01-22 LAB — PREPARE RBC (CROSSMATCH)

## 2020-01-22 LAB — VITAMIN B12: Vitamin B-12: 407 pg/mL (ref 180–914)

## 2020-01-22 MED ORDER — SODIUM CHLORIDE 0.9 % IV BOLUS
1000.0000 mL | Freq: Once | INTRAVENOUS | Status: AC
  Administered 2020-01-22: 1000 mL via INTRAVENOUS

## 2020-01-22 MED ORDER — SODIUM CHLORIDE 0.9 % IV SOLN
10.0000 mL/h | Freq: Once | INTRAVENOUS | Status: AC
  Administered 2020-01-23: 10 mL/h via INTRAVENOUS

## 2020-01-22 MED ORDER — SODIUM CHLORIDE 0.9 % IV SOLN
80.0000 mg | Freq: Once | INTRAVENOUS | Status: AC
  Administered 2020-01-22: 80 mg via INTRAVENOUS
  Filled 2020-01-22: qty 80

## 2020-01-22 MED ORDER — SODIUM CHLORIDE 0.9 % IV SOLN
8.0000 mg/h | INTRAVENOUS | Status: DC
  Administered 2020-01-22 – 2020-01-23 (×3): 8 mg/h via INTRAVENOUS
  Filled 2020-01-22 (×3): qty 80

## 2020-01-22 MED ORDER — ONDANSETRON HCL 4 MG PO TABS: 4.0000 mg | ORAL_TABLET | Freq: Four times a day (QID) | ORAL | Status: DC | PRN

## 2020-01-22 MED ORDER — LABETALOL HCL 5 MG/ML IV SOLN
10.0000 mg | INTRAVENOUS | Status: DC | PRN
  Administered 2020-01-23: 10 mg via INTRAVENOUS
  Filled 2020-01-22: qty 4

## 2020-01-22 MED ORDER — ONDANSETRON HCL 4 MG/2ML IJ SOLN: 4.0000 mg | Freq: Four times a day (QID) | INTRAMUSCULAR | Status: DC | PRN

## 2020-01-22 NOTE — H&P (Signed)
History and Physical    Lori Rush [id number] DOB: 1946-04-05 DOA: 01/22/2020  PCP: Glendon Axe, MD  Patient coming from: Home.  Chief Complaint: Weakness.  HPI: Lori Rush is a 74 y.o. female with history of hypertension chronic and disease stage II was admitted in March 2021 3 months ago for symptomatic anemia at that time patient was transfused and discharged home for follow-up with GI as outpatient.  Patient has had an EGD 4 weeks ago and a colonoscopy 2 weeks ago and as per the patient EGD showed some bacteria and was advised that she will need antibiotics colonoscopy was fine as per the patient.  Reports are not available to [place].  Over the last 3 days patient has become very weak with exertional fatigue and shortness of breath.  Had gone to her PCP today was found to be hypotensive and was advised to come to the ER.  Patient has been taking iron pills since patient was diagnosed with symptomatic anemia 3 months ago with low ferritin.  But last 2 days patient has noticed black stools.  Denies any abdominal pain chest pain vomiting or diarrhea or fever chills.  Patient denies taking any NSAIDs or aspirin.  ED Course: In the ER patient blood pressure was more than 742 systolic EKG is pending.  Stool for occult blood was positive and melanotic.  Labs show hemoglobin of 6.2 with hematocrit of 20.7 creatinine at baseline of 1.1 and potassium 3.3.  Patient has 2 units of PRBC ordered along with Protonix infusion started and on-call [place] gastroenterologist Dr. Hilarie Fredrickson was consulted and will be seeing patient in consult.  Review of Systems: As per HPI, rest all negative.   Past Medical History:  Diagnosis Date  . Hypertension     History reviewed. No pertinent surgical history.   reports that she has never smoked. She has never used smokeless tobacco. She reports that she does not drink alcohol and does not use drugs.  No Known Allergies  Family History  Problem Relation Age of  Onset  . Diabetes Mother   . Diabetes Father   . Hypertension Father     Prior to Admission medications   Medication Sig Start Date End Date Taking? Authorizing Provider  amLODipine (NORVASC) 5 MG tablet Take 5 mg by mouth daily. 10/08/19  Yes [provider]  atorvastatin (LIPITOR) 20 MG tablet Take 20 mg by mouth at bedtime. 10/11/19  Yes [provider]  docusate sodium (COLACE) 100 MG capsule Take 2 capsules (200 mg total) by mouth daily. 10/18/19 10/17/20 Yes Georgette Shell, MD  fluticasone [facility]) 50 MCG/ACT nasal spray Place 1 spray into both nostrils daily as needed for allergies or rhinitis.  12/30/19  Yes [provider]  Iron, Ferrous Sulfate, 325 (65 Fe) MG TABS Take 325 mg by mouth in the morning and at bedtime. 10/18/19  Yes Georgette Shell, MD  losartan (COZAAR) 100 MG tablet Take 100 mg by mouth daily. 08/19/19  Yes [provider]  metoprolol (LOPRESSOR) 100 MG tablet Take 100 mg by mouth 2 (two) times daily.   Yes [provider]  ondansetron (ZOFRAN) 4 MG tablet Take 1 tablet (4 mg total) by mouth every 6 (six) hours as needed for nausea. 10/18/19  Yes Georgette Shell, MD  vitamin C (ASCORBIC ACID) 250 MG tablet Take 1 tablet (250 mg total) by mouth 2 (two) times daily. 10/18/19  Yes Georgette Shell, MD    Physical Exam: Constitutional: Moderately  built and nourished. Vitals:   01/22/20 1449 01/22/20 1845 01/22/20 2030 01/22/20 2031  BP: 122/72 136/72 123/65 137/81  Pulse: 76 98 80 74  Resp: 17 19 16  (!) 25  Temp: 98.5 F (36.9 C) 98.8 F (37.1 C)    TempSrc:  Oral    SpO2: 100% 100% 100% 97%   Eyes: Anicteric no pallor. ENMT: No discharge from the ears eyes nose or mouth. Neck: No mass felt.  No neck rigidity. Respiratory: No rhonchi or crepitations. Cardiovascular: S1-S2 heard. Abdomen: Soft nontender bowel sounds present. Musculoskeletal: No edema. Skin: No rash. Neurologic: Alert awake oriented  time place and person.  Moves all extremities. Psychiatric: Appears normal per normal affect.   Labs on Admission: I have personally reviewed following labs and imaging studies  CBC: Recent Labs  Lab 01/22/20 2021  WBC 7.4  NEUTROABS 4.3  HGB 6.2*  HCT 20.7*  MCV 84.1  PLT 426   Basic Metabolic Panel: Recent Labs  Lab 01/22/20 2021  NA 138  K 3.3*  CL 103  CO2 25  GLUCOSE 139*  BUN 31*  CREATININE 1.16*  CALCIUM 8.5*   GFR: CrCl cannot be calculated (Unknown ideal weight.). Liver Function Tests: Recent Labs  Lab 01/22/20 2021  AST 24  ALT 20  ALKPHOS 51  BILITOT 0.4  PROT 6.0*  ALBUMIN 3.5   No results for input(s): LIPASE, AMYLASE in the last 168 hours. No results for input(s): AMMONIA in the last 168 hours. Coagulation Profile: No results for input(s): INR, PROTIME in the last 168 hours. Cardiac Enzymes: No results for input(s): CKTOTAL, CKMB, CKMBINDEX, TROPONINI in the last 168 hours. BNP (last 3 results) No results for input(s): PROBNP in the last 8760 hours. HbA1C: No results for input(s): HGBA1C in the last 72 hours. CBG: No results for input(s): GLUCAP in the last 168 hours. Lipid Profile: No results for input(s): CHOL, HDL, LDLCALC, TRIG, CHOLHDL, LDLDIRECT in the last 72 hours. Thyroid Function Tests: No results for input(s): TSH, T4TOTAL, FREET4, T3FREE, THYROIDAB in the last 72 hours. Anemia Panel: Recent Labs    01/22/20 2021 01/22/20 2043  VITAMINB12  --  407  FOLATE  --  11.7  FERRITIN  --  24  TIBC  --  366  IRON  --  48  RETICCTPCT 5.2*  --    Urine analysis:    Component Value Date/Time   COLORURINE STRAW (A) 01/22/2020 2130   APPEARANCEUR CLEAR 01/22/2020 2130   LABSPEC 1.009 01/22/2020 2130   PHURINE 6.0 01/22/2020 2130   GLUCOSEU NEGATIVE 01/22/2020 2130   HGBUR NEGATIVE 01/22/2020 2130   BILIRUBINUR NEGATIVE 01/22/2020 2130   [place] NEGATIVE 01/22/2020 2130   PROTEINUR NEGATIVE 01/22/2020 2130   NITRITE  NEGATIVE 01/22/2020 2130   LEUKOCYTESUR NEGATIVE 01/22/2020 2130   Sepsis Labs: @LABRCNTIP (procalcitonin:4,lacticidven:4) )No results found for this or any previous visit (from the past 240 hour(s)).   Radiological Exams on Admission: No results found.   Assessment/Plan Principal Problem:   Acute GI bleeding Active Problems:   Hypertension   CKD (chronic kidney disease) stage 2, GFR 60-89 ml/min   Acute blood loss anemia    1. Acute GI bleeding -patient has had recent EGD and colonoscopy as outpatient at [facility] reports of which are not available to [place].  We will need to get it in the morning.  Since patient has melanotic stools Protonix has been started and [place] gastroenterologist Dr. Hilarie Fredrickson will be seeing patient in consult.  2 units  of PRBC transfusion has been ordered. 2. Symptomatic anemia receiving 2 units of PRBC transfusion. 3. Hypertension -was hypotensive at home and also at the PCPs office.  Presently n.p.o.  We will keep patient on as needed IV hydralazine for systolic blood pressure more than 160. 4. Chronic kidney disease stage II creatinine appears to be at baseline.  Since patient has symptomatic anemia with hypotension will need close monitoring for any further worsening in inpatient status.   DVT prophylaxis: SCDs for now avoiding anticoagulation due to GI bleed. Code Status: Full code. Family Communication: Patient's sister. Disposition Plan: Home. Consults called: Copywriter, advertising. Admission status: Inpatient.   [name] MD [facility] Pager [phone].  If 7PM-7AM, please contact night-coverage [email or web address] Password [facility]  01/22/2020, 11:16 PM

## 2020-01-22 NOTE — ED Notes (Signed)
Labeled urine specimen and culture sent to lab. ENMiles 

## 2020-01-22 NOTE — ED Triage Notes (Signed)
Pt reports her BP been low the past 2 days. First day didn't take nay HTN meds. Yesterday only took Metoprolol yesterday morning but none last night due to being 25J systolic over 55.

## 2020-01-22 NOTE — ED Provider Notes (Signed)
[facility] DEPT Provider Note   CSN: [id number] Arrival date & time: 01/22/20  1440     History Chief Complaint  Patient presents with  . low blood pressure    Archie Atilano is a 74 y.o. female history of hypertension, iron deficiency anemia requiring transfusion here presenting with low blood pressure.  Patient states that she been taking her blood pressure and and was 96/50 yesterday.  She feels slightly lightheaded and dizzy but denies passing out.  Denies any fevers or chills.  Patient states that she had episode of vomiting yesterday and had poor appetite but denies any vomiting today.  Denies any abdominal pain currently.  Patient was admitted for symptomatic anemia secondary to iron deficiency several months ago.  Patient denies any black stools or blood in her stools.  Patient states that she is taking her iron supplement.  The history is provided by the patient.       Past Medical History:  Diagnosis Date  . Hypertension     Patient Active Problem List   Diagnosis Date Noted  . Mild renal insufficiency 10/17/2019  . Symptomatic anemia 10/16/2019  . Hypertension     History reviewed. No pertinent surgical history.   OB History   No obstetric history on file.     Family History  Problem Relation Age of Onset  . Diabetes Mother   . Diabetes Father   . Hypertension Father     Social History   Tobacco Use  . Smoking status: Never Smoker  . Smokeless tobacco: Never Used  Substance Use Topics  . Alcohol use: No  . Drug use: No    Home Medications Prior to Admission medications   Medication Sig Start Date End Date Taking? Authorizing Provider  amLODipine (NORVASC) 5 MG tablet Take 5 mg by mouth daily. 10/08/19  Yes [provider]  atorvastatin (LIPITOR) 20 MG tablet Take 20 mg by mouth at bedtime. 10/11/19  Yes [provider]  docusate sodium (COLACE) 100 MG capsule Take 2 capsules (200 mg total) by mouth  daily. 10/18/19 10/17/20 Yes Georgette Shell, MD  fluticasone [facility]) 50 MCG/ACT nasal spray Place 1 spray into both nostrils daily as needed for allergies or rhinitis.  12/30/19  Yes [provider]  Iron, Ferrous Sulfate, 325 (65 Fe) MG TABS Take 325 mg by mouth in the morning and at bedtime. 10/18/19  Yes Georgette Shell, MD  losartan (COZAAR) 100 MG tablet Take 100 mg by mouth daily. 08/19/19  Yes [provider]  metoprolol (LOPRESSOR) 100 MG tablet Take 100 mg by mouth 2 (two) times daily.   Yes [provider]  ondansetron (ZOFRAN) 4 MG tablet Take 1 tablet (4 mg total) by mouth every 6 (six) hours as needed for nausea. 10/18/19  Yes Georgette Shell, MD  vitamin C (ASCORBIC ACID) 250 MG tablet Take 1 tablet (250 mg total) by mouth 2 (two) times daily. 10/18/19  Yes Georgette Shell, MD    Allergies    Patient has no known allergies.  Review of Systems   Review of Systems  Neurological: Positive for dizziness.  All other systems reviewed and are negative.   Physical Exam Updated Vital Signs BP 137/81 (BP Location: Left Arm)   Pulse 74   Temp 98.8 F (37.1 C) (Oral)   Resp (!) 25   SpO2 97%   Physical Exam Vitals and nursing note reviewed.  Constitutional:      Comments: Slightly pale  HENT:     Head: Normocephalic.     Nose: Nose normal.     Mouth/Throat:     Mouth: Mucous membranes are moist.  Eyes:     Comments: Conjunctiva slightly pale   Cardiovascular:     Rate and Rhythm: Normal rate and regular rhythm.     Pulses: Normal pulses.     Heart sounds: Normal heart sounds.  Pulmonary:     Effort: Pulmonary effort is normal.     Breath sounds: Normal breath sounds.  Abdominal:     General: Abdomen is flat.     Palpations: Abdomen is soft.  Musculoskeletal:        General: Normal range of motion.     Cervical back: Normal range of motion and neck supple.  Skin:    General: Skin is warm.     Capillary Refill: Capillary  refill takes less than 2 seconds.  Neurological:     General: No focal deficit present.     Mental Status: She is alert and oriented to person, place, and time.  Psychiatric:        Mood and Affect: Mood normal.        Behavior: Behavior normal.     ED Results / Procedures / Treatments   Labs (all labs ordered are listed, but only abnormal results are displayed) Labs Reviewed  CBC WITH DIFFERENTIAL/PLATELET - Abnormal; Notable for the following components:      Result Value   RBC 2.46 (*)    Hemoglobin 6.2 (*)    HCT 20.7 (*)    MCH 25.2 (*)    All other components within normal limits  COMPREHENSIVE METABOLIC PANEL - Abnormal; Notable for the following components:   Potassium 3.3 (*)    Glucose, Bld 139 (*)    BUN 31 (*)    Creatinine, Ser 1.16 (*)    Calcium 8.5 (*)    Total Protein 6.0 (*)    GFR calc non Af Amer 46 (*)    GFR calc Af Amer 53 (*)    All other components within normal limits  POC OCCULT BLOOD, ED - Abnormal; Notable for the following components:   Fecal Occult Bld POSITIVE (*)    All other components within normal limits  SARS CORONAVIRUS 2 BY RT PCR (HOSPITAL ORDER, [place] [facility])  URINALYSIS, ROUTINE W REFLEX MICROSCOPIC  VITAMIN B12  FOLATE  IRON AND TIBC  FERRITIN  RETICULOCYTES  TYPE AND SCREEN  PREPARE RBC (CROSSMATCH)    EKG None  Radiology No results found.  Procedures Procedures (including critical care time)  CRITICAL CARE Performed by: Wandra Arthurs   Total critical care time: 30 minutes  Critical care time was exclusive of separately billable procedures and treating other patients.  Critical care was necessary to treat or prevent imminent or life-threatening deterioration.  Critical care was time spent personally by me on the following activities: development of treatment plan with patient and/or surrogate as well as nursing, discussions with consultants, evaluation of patient's response to treatment,  examination of patient, obtaining history from patient or surrogate, ordering and performing treatments and interventions, ordering and review of laboratory studies, ordering and review of radiographic studies, pulse oximetry and re-evaluation of patient's condition.   Medications Ordered in ED Medications  0.9 %  sodium chloride infusion (has no administration in time range)  pantoprazole (PROTONIX) 80 mg in sodium chloride 0.9 % 100 mL IVPB (has no administration in time range)  pantoprazole (PROTONIX) 80 mg in sodium chloride 0.9 % 100 mL (0.8 mg/mL) infusion (has no administration in time range)  sodium chloride 0.9 % bolus 1,000 mL (1,000 mLs Intravenous New Bag/Given 01/22/20 2028)    ED Course  I have reviewed the triage vital signs and the nursing notes.  Pertinent labs & imaging results that were available during my care of the patient were reviewed by me and considered in my medical decision making (see chart for details).    MDM Rules/Calculators/A&P                          Ameah Chanda is a 74 y.o. female who presented with hypotension at home.  Patient is afebrile and does not appear septic.  Patient is not hypotensive in the ED.  Her blood pressure today is 136/72.  However patient does appear pale and I wonder if she has symptomatic anemia again.  Plan to get CBC, CMP, urinalysis.  Will hydrate patient and reassess.  9:40 PM Patient's Hg is 6.2.  Guaiac positive with black stools.  Patient is on iron supplement so it is hard to say if she has slow GI bleed or not.  I ordered 1 unit of blood as well as Protonix IV bolus and drip.  I talked to Dr. Hilarie Fredrickson from GI.  Patient is followed up at [facility] and I do not have a colonoscopy in our system.  Hospitalist to admit and Dr. Hilarie Fredrickson to see in the morning.  Anemia panel sent again to recheck the iron level  Final Clinical Impression(s) / ED Diagnoses Final diagnoses:  None    Rx / DC Orders ED Discharge Orders    None        Drenda Freeze, MD 01/22/20 2141

## 2020-01-23 DIAGNOSIS — N182 Chronic kidney disease, stage 2 (mild): Secondary | ICD-10-CM

## 2020-01-23 DIAGNOSIS — K922 Gastrointestinal hemorrhage, unspecified: Secondary | ICD-10-CM

## 2020-01-23 DIAGNOSIS — D649 Anemia, unspecified: Secondary | ICD-10-CM

## 2020-01-23 LAB — CBC
HCT: 22 % — ABNORMAL LOW (ref 36.0–46.0)
Hemoglobin: 6.7 g/dL — CL (ref 12.0–15.0)
MCH: 26.4 pg (ref 26.0–34.0)
MCHC: 30.5 g/dL (ref 30.0–36.0)
MCV: 86.6 fL (ref 80.0–100.0)
Platelets: 123 10*3/uL — ABNORMAL LOW (ref 150–400)
RBC: 2.54 MIL/uL — ABNORMAL LOW (ref 3.87–5.11)
RDW: 15.4 % (ref 11.5–15.5)
WBC: 4.6 10*3/uL (ref 4.0–10.5)
nRBC: 0.7 % — ABNORMAL HIGH (ref 0.0–0.2)

## 2020-01-23 LAB — PREPARE RBC (CROSSMATCH)

## 2020-01-23 LAB — BASIC METABOLIC PANEL
Anion gap: 5 (ref 5–15)
BUN: 20 mg/dL (ref 8–23)
CO2: 25 mmol/L (ref 22–32)
Calcium: 7.7 mg/dL — ABNORMAL LOW (ref 8.9–10.3)
Chloride: 111 mmol/L (ref 98–111)
Creatinine, Ser: 1.03 mg/dL — ABNORMAL HIGH (ref 0.44–1.00)
GFR calc Af Amer: >60 mL/min (ref >60)
GFR calc non Af Amer: 53 mL/min — ABNORMAL LOW (ref >60)
Glucose, Bld: 111 mg/dL — ABNORMAL HIGH (ref 70–99)
Potassium: 3.3 mmol/L — ABNORMAL LOW (ref 3.5–5.1)
Sodium: 141 mmol/L (ref 135–145)

## 2020-01-23 LAB — SARS CORONAVIRUS 2 BY RT PCR (HOSPITAL ORDER, PERFORMED IN ~~LOC~~ HOSPITAL LAB): SARS Coronavirus 2: NEGATIVE

## 2020-01-23 LAB — CBG MONITORING, ED
Glucose-Capillary: 104 mg/dL — ABNORMAL HIGH (ref 70–99)
Glucose-Capillary: 124 mg/dL — ABNORMAL HIGH (ref 70–99)

## 2020-01-23 LAB — HEMOGLOBIN AND HEMATOCRIT, BLOOD
HCT: 28.8 % — ABNORMAL LOW (ref 36.0–46.0)
Hemoglobin: 9 g/dL — ABNORMAL LOW (ref 12.0–15.0)

## 2020-01-23 MED ORDER — POTASSIUM CHLORIDE CRYS ER 20 MEQ PO TBCR
40.0000 meq | EXTENDED_RELEASE_TABLET | Freq: Once | ORAL | Status: AC
  Administered 2020-01-23: 40 meq via ORAL
  Filled 2020-01-23: qty 2

## 2020-01-23 MED ORDER — PEG-KCL-NACL-NASULF-NA ASC-C 100 G PO SOLR
0.5000 | Freq: Once | ORAL | Status: AC
  Administered 2020-01-23: 100 g via ORAL
  Filled 2020-01-23: qty 1

## 2020-01-23 MED ORDER — PANTOPRAZOLE SODIUM 40 MG IV SOLR
40.0000 mg | Freq: Two times a day (BID) | INTRAVENOUS | Status: DC
  Administered 2020-01-23 – 2020-01-26 (×6): 40 mg via INTRAVENOUS
  Filled 2020-01-23 (×6): qty 40

## 2020-01-23 MED ORDER — POLYETHYLENE GLYCOL 3350 17 GM/SCOOP PO POWD
0.5000 | Freq: Once | ORAL | Status: DC
  Filled 2020-01-23: qty 255

## 2020-01-23 NOTE — Consult Note (Addendum)
Referring Provider:  [facility]         Primary Care Physician:  Glendon Axe, MD Primary Gastroenterologist:  [facility]           We were asked to see this patient for:   anemia               ASSESSMENT /  PLAN    Lori Rush is a 75 y.o. female PMH significant for, but not necessarily limited to, HTN and hyperlipidemia.   # Recurrent, symptomatic iron deficiency anemia / dark stools ( iron), FOBT positive.  --Admitted in March 2021 with IDA. Transfused 2 units of blood for hgb of 5. Hgb at discharge was 8.9, sent home on iron. FOBT negative at that time. Now with hgb of 6.6.  --Compliant with daily iron.  --Getting blood now.  --Requested and received colonoscopy/EGD reports from [place].  Colonoscopy was complete.  No findings other than mild diverticulosis.  EGD with biopsies remarkable for gastritis.  Small bowel biopsies negative.  Gastric biopsies compatible with H. pylori gastritis.  Patient has not yet started treatment for H. pylori, she has a follow-up GI appointment in July --Not actively bleeding, no bowel movement in at least 2 days.  No stool in vault on DRE today.  --Patient will need small bowel video capsule study, this can be done in the a.m.  In the interim transfuse and supportive care --Will leave PPI infusion going for now.   HPI:    Chief Complaint: dizzy, weakness  Lori Rush is a 74 y.o. female with PMH as listed above. She was hospitalized in [place] in March 2021 with symptomatic anemia and dark stools.Notes mention that she was taking Motrin at the time but she denies that.  FOBT was negative. Hgb was 5.1, transfused 2 u PRBC. Discharged home with plans for outpatient GI workup. Patient says she had EGD / colonoscopy approx one month ago. Believes colonoscopy was okay. Told she had a bacteria in her stomach but wasn't given any medications. She is scheduled for follow up with GI in July.   Three days ago patient had  non-radiating, stabbing epigastric pain which lasted 10 minutes and never returned. Yesterday she vomited green liquid. No other GI complaints. Bowels moving okay. Appetite has been diminished over the last few days. No NSAID use. No hx of PUD. LFTs normal.   PREVIOUS ENDOSCOPIC EVALUATIONS / GI STUDIES :  EGD and colonoscopy 12/22/19  --[facility] -done for IDA --gastritis --mild diverticulosis.  --negative small bowel biopsies --Gastric biopsies positive for H. Pylori.   Past Medical History:  Diagnosis Date  . Hypertension     History reviewed. No pertinent surgical history.  Prior to Admission medications   Medication Sig Start Date End Date Taking? Authorizing Provider  amLODipine (NORVASC) 5 MG tablet Take 5 mg by mouth daily. 10/08/19  Yes [provider]  atorvastatin (LIPITOR) 20 MG tablet Take 20 mg by mouth at bedtime. 10/11/19  Yes [provider]  docusate sodium (COLACE) 100 MG capsule Take 2 capsules (200 mg total) by mouth daily. 10/18/19 10/17/20 Yes Georgette Shell, MD  fluticasone [facility]) 50 MCG/ACT nasal spray Place 1 spray into both nostrils daily as needed for allergies or rhinitis.  12/30/19  Yes [provider]  Iron, Ferrous Sulfate, 325 (65 Fe) MG TABS Take 325 mg by mouth in the morning and at bedtime. 10/18/19  Yes Georgette Shell, MD  losartan (COZAAR) 100 MG  tablet Take 100 mg by mouth daily. 08/19/19  Yes [provider]  metoprolol (LOPRESSOR) 100 MG tablet Take 100 mg by mouth 2 (two) times daily.   Yes [provider]  ondansetron (ZOFRAN) 4 MG tablet Take 1 tablet (4 mg total) by mouth every 6 (six) hours as needed for nausea. 10/18/19  Yes Georgette Shell, MD  vitamin C (ASCORBIC ACID) 250 MG tablet Take 1 tablet (250 mg total) by mouth 2 (two) times daily. 10/18/19  Yes Georgette Shell, MD    Current Facility-Administered Medications  Medication Dose Route Frequency Provider Last Rate  Last Admin  . labetalol (NORMODYNE) injection 10 mg  10 mg Intravenous Q2H PRN [name], MD      . ondansetron [facility]) tablet 4 mg  4 mg Oral Q6H PRN [name], MD       Or  . ondansetron [facility]) injection 4 mg  4 mg Intravenous Q6H PRN [name], MD      . pantoprazole (PROTONIX) 80 mg in sodium chloride 0.9 % 100 mL (0.8 mg/mL) infusion  8 mg/hr Intravenous Continuous [name], MD 10 mL/hr at 01/23/20 0726 8 mg/hr at 01/23/20 2202   Current Outpatient Medications  Medication Sig Dispense Refill  . amLODipine (NORVASC) 5 MG tablet Take 5 mg by mouth daily.    Marland Kitchen atorvastatin (LIPITOR) 20 MG tablet Take 20 mg by mouth at bedtime.    . docusate sodium (COLACE) 100 MG capsule Take 2 capsules (200 mg total) by mouth daily. 30 capsule 2  . fluticasone (FLONASE) 50 MCG/ACT nasal spray Place 1 spray into both nostrils daily as needed for allergies or rhinitis.     . Iron, Ferrous Sulfate, 325 (65 Fe) MG TABS Take 325 mg by mouth in the morning and at bedtime. 30 tablet   . losartan (COZAAR) 100 MG tablet Take 100 mg by mouth daily.    . metoprolol (LOPRESSOR) 100 MG tablet Take 100 mg by mouth 2 (two) times daily.    . ondansetron (ZOFRAN) 4 MG tablet Take 1 tablet (4 mg total) by mouth every 6 (six) hours as needed for nausea. 20 tablet 0  . vitamin C (ASCORBIC ACID) 250 MG tablet Take 1 tablet (250 mg total) by mouth 2 (two) times daily.      Allergies as of 01/22/2020  . (No Known Allergies)    Family History  Problem Relation Age of Onset  . Diabetes Mother   . Diabetes Father   . Hypertension Father     Social History   Socioeconomic History  . Marital status: Single    Spouse name: Not on file  . Number of children: Not on file  . Years of education: Not on file  . Highest education level: Not on file  Occupational History  . Not on file  Tobacco Use  . Smoking status: Never Smoker  . Smokeless tobacco: Never Used  Substance  and Sexual Activity  . Alcohol use: No  . Drug use: No  . Sexual activity: Not on file  Other Topics Concern  . Not on file  Social History Narrative  . Not on file   Social Determinants of Health   Financial Resource Strain:   . Difficulty of Paying Living Expenses:   Food Insecurity:   . Worried About Charity fundraiser in the Last Year:   . Arboriculturist in the Last Year:   Transportation Needs:   .  Lack of Transportation (Medical):   Marland Kitchen Lack of Transportation (Non-Medical):   Physical Activity:   . Days of Exercise per Week:   . Minutes of Exercise per Session:   Stress:   . Feeling of Stress :   Social Connections:   . Frequency of Communication with Friends and Family:   . Frequency of Social Gatherings with Friends and Family:   . Attends Religious Services:   . Active Member of Clubs or Organizations:   . Attends Archivist Meetings:   Marland Kitchen Marital Status:   Intimate Partner Violence:   . Fear of Current or Ex-Partner:   . Emotionally Abused:   Marland Kitchen Physically Abused:   . Sexually Abused:     Review of Systems: All systems reviewed and negative except where noted in HPI.  Physical Exam: Vital signs in last 24 hours: Temp:  [98.1 F (36.7 C)-98.8 F (37.1 C)] 98.2 F (36.8 C) (06/25 0858) Pulse Rate:  [59-98] 69 (06/25 1000) Resp:  [16-25] 16 (06/25 1000) BP: (99-140)/(55-81) 124/69 (06/25 1000) SpO2:  [97 %-100 %] 100 % (06/25 1000)   General:   Alert, well-developed,  female in NAD Psych:  Pleasant, cooperative. Normal mood and affect. Eyes:  Pupils equal, sclera clear, no icterus.   Conjunctiva pink. Ears:  Normal auditory acuity. Nose:  No deformity, discharge,  or lesions. Neck:  Supple; no masses Lungs:  Clear throughout to auscultation.   No wheezes, crackles, or rhonchi.  Heart:  Regular rate and rhythm; no murmurs, no lower extremity edema Abdomen:  Soft, non-distended, nontender, BS active, no palp mass   Rectal:  Small amount of  dark flecks of stool in vault.   Msk:  Symmetrical without gross deformities. . Neurologic:  Alert and  oriented x4;  grossly normal neurologically. Skin:  Intact without significant lesions or rashes.   Intake/Output from previous day: 06/24 0701 - 06/25 0700 In: 1414 [Blood:315; IV Piggyback:1099] Out: -  Intake/Output this shift: Total I/O In: 85.9 [I.V.:85.9] Out: -   Lab Results: Recent Labs    01/22/20 2021 01/23/20 0828  WBC 7.4 4.6  HGB 6.2* 6.7*  HCT 20.7* 22.0*  PLT 163 123*   BMET Recent Labs    01/22/20 2021 01/23/20 0754  NA 138 141  K 3.3* 3.3*  CL 103 111  CO2 25 25  GLUCOSE 139* 111*  BUN 31* 20  CREATININE 1.16* 1.03*  CALCIUM 8.5* 7.7*   LFT Recent Labs    01/22/20 2021  PROT 6.0*  ALBUMIN 3.5  AST 24  ALT 20  ALKPHOS 51  BILITOT 0.4   PT/INR No results for input(s): LABPROT, INR in the last 72 hours. Hepatitis Panel No results for input(s): HEPBSAG, HCVAB, HEPAIGM, HEPBIGM in the last 72 hours.   . CBC Latest Ref Rng & Units 01/23/2020 01/22/2020 10/18/2019  WBC 4.0 - 10.5 K/uL 4.6 7.4 7.0  Hemoglobin 12.0 - 15.0 g/dL 6.7(LL) 6.2(LL) 8.9(L)  Hematocrit 36 - 46 % 22.0(L) 20.7(L) 28.6(L)  Platelets 150 - 400 K/uL 123(L) 163 287    . CMP Latest Ref Rng & Units 01/23/2020 01/22/2020 10/18/2019  Glucose 70 - 99 mg/dL 111(H) 139(H) 106(H)  BUN 8 - 23 mg/dL 20 31(H) 12  Creatinine 0.44 - 1.00 mg/dL 1.03(H) 1.16(H) 1.11(H)  Sodium 135 - 145 mmol/L 141 138 141  Potassium 3.5 - 5.1 mmol/L 3.3(L) 3.3(L) 3.7  Chloride 98 - 111 mmol/L 111 103 107  CO2 22 - 32 mmol/L 25 25 27  Calcium 8.9 - 10.3 mg/dL 7.7(L) 8.5(L) 8.4(L)  Total Protein 6.5 - 8.1 g/dL - 6.0(L) -  Total Bilirubin 0.3 - 1.2 mg/dL - 0.4 -  Alkaline Phos 38 - 126 U/L - 51 -  AST 15 - 41 U/L - 24 -  ALT 0 - 44 U/L - 20 -   Studies/Results: No results found.  Principal Problem:   Acute GI bleeding Active Problems:   Hypertension   CKD (chronic kidney disease) stage 2,  GFR 60-89 ml/min   Acute blood loss anemia    Tye Savoy, NP-C @  01/23/2020, 10:36 AM

## 2020-01-23 NOTE — ED Notes (Signed)
Holding on 0500 lab work d/t blood administration.

## 2020-01-23 NOTE — Progress Notes (Signed)
PROGRESS NOTE    Lori Rush  FUX:[id number] DOB: [birth year] DOA: 01/22/2020 PCP: Glendon Axe, MD   Brief Narrative: Lori Rush is a 74 y.o. female with history of hypertension, CKD stage III, hyperlipidemia.  Patient presented secondary to weakness and was found to have a hemoglobin of 6.2.   Assessment & Plan:   Principal Problem:   Acute GI bleeding Active Problems:   Hypertension   CKD (chronic kidney disease) stage 2, GFR 60-89 ml/min   Acute blood loss anemia   Acute GI bleeding Acute blood loss anemia Iron deficiency anemia Recurrent issue.  Patient has a recent colonoscopy which was performed at an outside hospital.  Per patient, GI has requested records.  Patient has received 2 units of PRBC to date.  No recurrent GI bleeding noted.  Patient is on iron supplementation as an outpatient.  Hemoglobin improved from 6.2 to 6.7 posttransfusion which appears inadequate. -GI recommendations: PPI infusion, capsule study -Repeat H&H this afternoon -Daily CBC  Essential hypertension Patient is on amlodipine, losartan, metoprolol as an outpatient.  Blood pressures mildly uncontrolled while inpatient.  Antihypertensives were held secondary to concern for GI bleeding.  As needed labetalol was ordered on admission. -Continue labetalol 10 mg IV as needed  CKD stage IIIa Stable.  Hyperlipidemia Patient is on Lipitor 20 mg daily as an outpatient   DVT prophylaxis: SCDs secondary to GI bleed Code Status:   Code Status: Full Code Family Communication: None at bedside Disposition Plan: Anticipate patient will discharge home and probably 2 to 3 days pending continued GI work-up for GI bleeding.   Consultants:   [facility]  Procedures:   None  Antimicrobials:  None   Subjective: Patient reports no melena or hematochezia overnight.  No abdominal pain.  No hematemesis.  Objective: Vitals:   01/23/20 1300 01/23/20 1330 01/23/20 1400 01/23/20 1430  BP: (!) 142/75  (!) 143/69 133/74 (!) 149/75  Pulse: 70 65 (!) 58 64  Resp: 16 16 16 16   Temp:      TempSrc:      SpO2: 100% 100% 100% 100%    Intake/Output Summary (Last 24 hours) at 01/23/2020 1503 Last data filed at 01/23/2020 1456 Gross per 24 hour  Intake 1956.93 ml  Output -  Net 1956.93 ml   There were no vitals filed for this visit.  Examination:  General exam: Appears calm and comfortable Respiratory system: Clear to auscultation. Respiratory effort normal. Cardiovascular system: S1 & S2 heard, RRR. No murmurs, rubs, gallops or clicks. Gastrointestinal system: Abdomen is nondistended, soft and nontender. No organomegaly or masses felt. Normal bowel sounds heard. Central nervous system: Alert and oriented. No focal neurological deficits. Musculoskeletal: No edema. No calf tenderness Skin: No cyanosis. No rashes Psychiatry: Judgement and insight appear normal. Mood & affect appropriate.     Data Reviewed: I have personally reviewed following labs and imaging studies  CBC Lab Results  Component Value Date   WBC 4.6 01/23/2020   RBC 2.54 (L) 01/23/2020   HGB 6.7 (LL) 01/23/2020   HCT 22.0 (L) 01/23/2020   MCV 86.6 01/23/2020   MCH 26.4 01/23/2020   PLT 123 (L) 01/23/2020   MCHC 30.5 01/23/2020   RDW 15.4 01/23/2020   LYMPHSABS 2.4 01/22/2020   MONOABS 0.7 01/22/2020   EOSABS 0.1 01/22/2020   BASOSABS 0.0 02/54/2706     Last metabolic panel Lab Results  Component Value Date   NA 141 01/23/2020   K 3.3 (L) 01/23/2020   CL 111 01/23/2020  CO2 25 01/23/2020   BUN 20 01/23/2020   CREATININE 1.03 (H) 01/23/2020   GLUCOSE 111 (H) 01/23/2020   GFRNONAA 53 (L) 01/23/2020   GFRAA >60 01/23/2020   CALCIUM 7.7 (L) 01/23/2020   PROT 6.0 (L) 01/22/2020   ALBUMIN 3.5 01/22/2020   BILITOT 0.4 01/22/2020   ALKPHOS 51 01/22/2020   AST 24 01/22/2020   ALT 20 01/22/2020   ANIONGAP 5 01/23/2020    CBG (last 3)  Recent Labs    01/23/20 0052 01/23/20 0753  GLUCAP 124* 104*      GFR: CrCl cannot be calculated (Unknown ideal weight.).  Coagulation Profile: No results for input(s): INR, PROTIME in the last 168 hours.  Recent Results (from the past 240 hour(s))  SARS Coronavirus 2 by RT PCR (hospital order, performed in [facility] hospital lab) Nasopharyngeal Nasopharyngeal Swab     Status: None   Collection Time: 01/23/20  2:00 AM   Specimen: Nasopharyngeal Swab  Result Value Ref Range Status   SARS Coronavirus 2 NEGATIVE NEGATIVE Final    Comment: (NOTE) SARS-CoV-2 target nucleic acids are NOT DETECTED.  The SARS-CoV-2 RNA is generally detectable in upper and lower respiratory specimens during the acute phase of infection. The lowest concentration of SARS-CoV-2 viral copies this assay can detect is 250 copies / mL. A negative result does not preclude SARS-CoV-2 infection and should not be used as the sole basis for treatment or other patient management decisions.  A negative result may occur with improper specimen collection / handling, submission of specimen other than nasopharyngeal swab, presence of viral mutation(s) within the areas targeted by this assay, and inadequate number of viral copies (<250 copies / mL). A negative result must be combined with clinical observations, patient history, and epidemiological information.  Fact Sheet for Patients:   StrictlyIdeas.no  Fact Sheet for Healthcare Providers: BankingDealers.co.za  This test is not yet approved or  cleared by the [place] FDA and has been authorized for detection and/or diagnosis of SARS-CoV-2 by FDA under an Emergency Use Authorization (EUA).  This EUA will remain in effect (meaning this test can be used) for the duration of the COVID-19 declaration under Section 564(b)(1) of the Act, 21 U.S.C. section 360bbb-3(b)(1), unless the authorization is terminated or revoked sooner.  Performed at [facility], [place]  [address], [place], [place] [address]         Radiology Studies: No results found.      Scheduled Meds: . polyethylene glycol powder  0.5 Container Oral Once   Continuous Infusions: . pantoprozole (PROTONIX) infusion 8 mg/hr (01/23/20 1456)     LOS: 1 day     Cordelia Poche, MD [facility] 01/23/2020, 3:03 PM  If 7PM-7AM, please contact night-coverage [email or web address]

## 2020-01-24 ENCOUNTER — Encounter (HOSPITAL_COMMUNITY)
Disposition: A | Discharge status: Home / Self Care | Payer: Self-pay | Source: Home / Self Care | Attending: Family Medicine

## 2020-01-24 HISTORY — PX: GIVENS CAPSULE STUDY: SHX5432

## 2020-01-24 LAB — CBC
HCT: 29.9 % — ABNORMAL LOW (ref 36.0–46.0)
Hemoglobin: 9.5 g/dL — ABNORMAL LOW (ref 12.0–15.0)
MCH: 27.1 pg (ref 26.0–34.0)
MCHC: 31.8 g/dL (ref 30.0–36.0)
MCV: 85.2 fL (ref 80.0–100.0)
Platelets: 115 10*3/uL — ABNORMAL LOW (ref 150–400)
RBC: 3.51 MIL/uL — ABNORMAL LOW (ref 3.87–5.11)
RDW: 15.5 % (ref 11.5–15.5)
WBC: 6.8 10*3/uL (ref 4.0–10.5)
nRBC: 0.4 % — ABNORMAL HIGH (ref 0.0–0.2)

## 2020-01-24 LAB — GLUCOSE, CAPILLARY
Glucose-Capillary: 137 mg/dL — ABNORMAL HIGH (ref 70–99)
Glucose-Capillary: 144 mg/dL — ABNORMAL HIGH (ref 70–99)
Glucose-Capillary: 155 mg/dL — ABNORMAL HIGH (ref 70–99)
Glucose-Capillary: 176 mg/dL — ABNORMAL HIGH (ref 70–99)

## 2020-01-24 SURGERY — IMAGING PROCEDURE, GI TRACT, INTRALUMINAL, VIA CAPSULE
Anesthesia: LOCAL

## 2020-01-24 SURGICAL SUPPLY — 1 items: TOWEL COTTON PACK 4EA (MISCELLANEOUS) ×4 IMPLANT

## 2020-01-24 NOTE — Progress Notes (Signed)
Progress Note for [facility]  Subjective: No complaints.  No reports of any bleeding.  Objective: Vital signs in last 24 hours: Temp:  [97.5 F (36.4 C)-98.8 F (37.1 C)] 97.5 F (36.4 C) (06/26 0401) Pulse Rate:  [58-78] 75 (06/26 0401) Resp:  [16-20] 18 (06/26 0401) BP: (124-166)/(64-82) 128/75 (06/26 0401) SpO2:  [97 %-100 %] 97 % (06/26 0401) Weight:  [60.7 kg] 60.7 kg (06/25 1812) Last BM Date: 01/22/20  Intake/Output from previous day: 06/25 0701 - 06/26 0700 In: 781.6 [P.O.:236; I.V.:163.6; Blood:382] Out: -  Intake/Output this shift: No intake/output data recorded.  General appearance: alert and no distress GI: soft, non-tender; bowel sounds normal; no masses,  no organomegaly  Lab Results: Recent Labs    01/22/20 2021 01/22/20 2021 01/23/20 0828 01/23/20 1626 01/24/20 0418  WBC 7.4  --  4.6  --  6.8  HGB 6.2*   < > 6.7* 9.0* 9.5*  HCT 20.7*   < > 22.0* 28.8* 29.9*  PLT 163  --  123*  --  115*   < > = values in this interval not displayed.   BMET Recent Labs    01/22/20 2021 01/23/20 0754  NA 138 141  K 3.3* 3.3*  CL 103 111  CO2 25 25  GLUCOSE 139* 111*  BUN 31* 20  CREATININE 1.16* 1.03*  CALCIUM 8.5* 7.7*   LFT Recent Labs    01/22/20 2021  PROT 6.0*  ALBUMIN 3.5  AST 24  ALT 20  ALKPHOS 51  BILITOT 0.4   PT/INR No results for input(s): LABPROT, INR in the last 72 hours. Hepatitis Panel No results for input(s): HEPBSAG, HCVAB, HEPAIGM, HEPBIGM in the last 72 hours. C-Diff No results for input(s): CDIFFTOX in the last 72 hours. Fecal Lactopherrin No results for input(s): FECLLACTOFRN in the last 72 hours.  Studies/Results: No results found.  Medications:  Scheduled: . pantoprazole (PROTONIX) IV  40 mg Intravenous Q12H  . polyethylene glycol powder  0.5 Container Oral Once   Continuous:   Assessment/Plan: 1) GI bleed - ? Source. 2) Anemia. 3) History of untreated H. Pylori.   She is to undergo VCE today.  HGB is  stable after transfusions.  Plan: 1) Await VCE.  The results will be read tomorrow.  LOS: 2 days   Naftoli Penny D 01/24/2020, 9:29 AM

## 2020-01-24 NOTE — Progress Notes (Signed)
Pt ingested given capsules at 1400, tolerated well, instruction given to pt and bedside nurse, verbal acknowledgement noted

## 2020-01-24 NOTE — Progress Notes (Signed)
PROGRESS NOTE    Lori Rush  GUR:[id number] DOB: 1946/04/02 DOA: 01/22/2020 PCP: Glendon Axe, MD   Brief Narrative: Lori Rush is a 74 y.o. female with history of hypertension, CKD stage III, hyperlipidemia.  Patient presented secondary to weakness and was found to have a hemoglobin of 6.2. She is admitted for possible upper GI bleeding. She received blood transfusion. GI consulted for management.   Assessment & Plan:   Principal Problem:   Acute GI bleeding Active Problems:   Hypertension   CKD (chronic kidney disease) stage 2, GFR 60-89 ml/min   Acute blood loss anemia   Acute GI bleeding Acute blood loss anemia Iron deficiency anemia Recurrent issue.  Patient has a recent colonoscopy which was performed at an outside hospital.  Per patient, GI has requested records.  Patient has received 2 units of PRBC to date.  No recurrent GI bleeding noted.  Patient is on iron supplementation as an outpatient.  Hemoglobin improved from 6.2 to 6.7 posttransfusion initially and now up to 9.5 -GI recommendations: PPI injection, capsule study -Daily CBC  Essential hypertension Patient is on amlodipine, losartan, metoprolol as an outpatient.  Blood pressures mildly uncontrolled while inpatient.  Antihypertensives were held secondary to concern for GI bleeding.  As needed labetalol was ordered on admission. -Continue labetalol 10 mg IV as needed  CKD stage IIIa Stable.  Hyperlipidemia Patient is on Lipitor 20 mg daily as an outpatient   DVT prophylaxis: SCDs secondary to GI bleed Code Status:   Code Status: Full Code Family Communication: None at bedside Disposition Plan: Anticipate patient will discharge home and probably 2 to 3 days pending continued GI work-up for GI bleeding.   Consultants:   [facility]  Procedures:   None  Antimicrobials:  None   Subjective: No bleeding noted overnight.  Objective: Vitals:   01/23/20 1812 01/23/20 2005 01/24/20 0108 01/24/20  0401  BP: 132/73 (!) 146/69 (!) 149/82 128/75  Pulse: 71 65 78 75  Resp:  20 20 18   Temp:  98 F (36.7 C) 98.1 F (36.7 C) (!) 97.5 F (36.4 C)  TempSrc:  Oral Oral Oral  SpO2:  100% 98% 97%  Weight: 60.7 kg     Height: 5\' 3"  (1.6 m)       Intake/Output Summary (Last 24 hours) at 01/24/2020 1209 Last data filed at 01/23/2020 1700 Gross per 24 hour  Intake 313.68 ml  Output --  Net 313.68 ml   Filed Weights   01/23/20 1812  Weight: 60.7 kg    Examination:  General: Well appearing, no distress   Data Reviewed: I have personally reviewed following labs and imaging studies  CBC Lab Results  Component Value Date   WBC 6.8 01/24/2020   RBC 3.51 (L) 01/24/2020   HGB 9.5 (L) 01/24/2020   HCT 29.9 (L) 01/24/2020   MCV 85.2 01/24/2020   MCH 27.1 01/24/2020   PLT 115 (L) 01/24/2020   MCHC 31.8 01/24/2020   RDW 15.5 01/24/2020   LYMPHSABS 2.4 01/22/2020   MONOABS 0.7 01/22/2020   EOSABS 0.1 01/22/2020   BASOSABS 0.0 28/31/5176     Last metabolic panel Lab Results  Component Value Date   NA 141 01/23/2020   K 3.3 (L) 01/23/2020   CL 111 01/23/2020   CO2 25 01/23/2020   BUN 20 01/23/2020   CREATININE 1.03 (H) 01/23/2020   GLUCOSE 111 (H) 01/23/2020   GFRNONAA 53 (L) 01/23/2020   GFRAA >60 01/23/2020   CALCIUM 7.7 (L) 01/23/2020  PROT 6.0 (L) 01/22/2020   ALBUMIN 3.5 01/22/2020   BILITOT 0.4 01/22/2020   ALKPHOS 51 01/22/2020   AST 24 01/22/2020   ALT 20 01/22/2020   ANIONGAP 5 01/23/2020    CBG (last 3)  Recent Labs    01/23/20 0753 01/24/20 0110 01/24/20 0619  GLUCAP 104* 137* 155*     GFR: Estimated Creatinine Clearance: 38.4 mL/min (A) (by C-G formula based on SCr of 1.03 mg/dL (H)).  Coagulation Profile: No results for input(s): INR, PROTIME in the last 168 hours.  Recent Results (from the past 240 hour(s))  SARS Coronavirus 2 by RT PCR (hospital order, performed in [facility] hospital lab) Nasopharyngeal Nasopharyngeal Swab     Status:  None   Collection Time: 01/23/20  2:00 AM   Specimen: Nasopharyngeal Swab  Result Value Ref Range Status   SARS Coronavirus 2 NEGATIVE NEGATIVE Final    Comment: (NOTE) SARS-CoV-2 target nucleic acids are NOT DETECTED.  The SARS-CoV-2 RNA is generally detectable in upper and lower respiratory specimens during the acute phase of infection. The lowest concentration of SARS-CoV-2 viral copies this assay can detect is 250 copies / mL. A negative result does not preclude SARS-CoV-2 infection and should not be used as the sole basis for treatment or other patient management decisions.  A negative result may occur with improper specimen collection / handling, submission of specimen other than nasopharyngeal swab, presence of viral mutation(s) within the areas targeted by this assay, and inadequate number of viral copies (<250 copies / mL). A negative result must be combined with clinical observations, patient history, and epidemiological information.  Fact Sheet for Patients:   StrictlyIdeas.no  Fact Sheet for Healthcare Providers: BankingDealers.co.za  This test is not yet approved or  cleared by the [place] FDA and has been authorized for detection and/or diagnosis of SARS-CoV-2 by FDA under an Emergency Use Authorization (EUA).  This EUA will remain in effect (meaning this test can be used) for the duration of the COVID-19 declaration under Section 564(b)(1) of the Act, 21 U.S.C. section 360bbb-3(b)(1), unless the authorization is terminated or revoked sooner.  Performed at [facility], [place] [address] [place], [place] [address]         Radiology Studies: No results found.      Scheduled Meds: . pantoprazole (PROTONIX) IV  40 mg Intravenous Q12H  . polyethylene glycol powder  0.5 Container Oral Once   Continuous Infusions:    LOS: 2 days     Cordelia Poche, MD [facility] 01/24/2020,  12:09 PM  If 7PM-7AM, please contact night-coverage [email or web address]

## 2020-01-25 LAB — BPAM RBC
Blood Product Expiration Date: 202107192359
Blood Product Expiration Date: 202107252359
ISSUE DATE / TIME: 202106250244
ISSUE DATE / TIME: 202106250838
Unit Type and Rh: 7300
Unit Type and Rh: 7300

## 2020-01-25 LAB — TYPE AND SCREEN
ABO/RH(D): B POS
Antibody Screen: NEGATIVE
Unit division: 0
Unit division: 0

## 2020-01-25 LAB — CBC
HCT: 27.9 % — ABNORMAL LOW (ref 36.0–46.0)
Hemoglobin: 8.6 g/dL — ABNORMAL LOW (ref 12.0–15.0)
MCH: 26.7 pg (ref 26.0–34.0)
MCHC: 30.8 g/dL (ref 30.0–36.0)
MCV: 86.6 fL (ref 80.0–100.0)
Platelets: 144 10*3/uL — ABNORMAL LOW (ref 150–400)
RBC: 3.22 MIL/uL — ABNORMAL LOW (ref 3.87–5.11)
RDW: 16.2 % — ABNORMAL HIGH (ref 11.5–15.5)
WBC: 5.5 10*3/uL (ref 4.0–10.5)
nRBC: 0 % (ref 0.0–0.2)

## 2020-01-25 LAB — GLUCOSE, CAPILLARY
Glucose-Capillary: 100 mg/dL — ABNORMAL HIGH (ref 70–99)
Glucose-Capillary: 127 mg/dL — ABNORMAL HIGH (ref 70–99)

## 2020-01-25 MED ORDER — ADULT MULTIVITAMIN W/MINERALS CH
1.0000 | ORAL_TABLET | Freq: Every day | ORAL | Status: DC
  Administered 2020-01-25 – 2020-01-26 (×2): 1 via ORAL
  Filled 2020-01-25 (×2): qty 1

## 2020-01-25 MED ORDER — ENSURE ENLIVE PO LIQD
237.0000 mL | ORAL | Status: DC
  Administered 2020-01-25 – 2020-01-26 (×2): 237 mL via ORAL

## 2020-01-25 NOTE — Progress Notes (Signed)
Initial Nutrition Assessment  DOCUMENTATION CODES:   Not applicable  INTERVENTION:    Ensure Enlive po daily, each supplement provides 350 kcal and 20 grams of protein.  MVI with minerals daily.  NUTRITION DIAGNOSIS:   Altered GI function related to  (unknown etiology) as evidenced by  (GI bleeding, low Hgb).  GOAL:   Patient will meet greater than or equal to 90% of their needs  MONITOR:   PO intake, Supplement acceptance, Labs  REASON FOR ASSESSMENT:   Malnutrition Screening Tool    ASSESSMENT:   74 yo female admitted with upper GI bleeding, S/P blood transfusion on admission. PMH includes HTN, CKD stage III, HLD.   GI following. Capsule endoscopy results to be read today to determine source of bleeding.   Currently on a regular diet, meal intakes recorded at 100% for 2 meals. Attempted to speak with patient over the phone, but no answer when room number called. Weight encounters reviewed. 3% weight loss within the past 3 months is not significant for the time frame.   Labs reviewed. Hgb 8.6 (L), K 3.3 (L on 6/25) CBG: 144-176-100  Medications reviewed and include Protonix.   NUTRITION - FOCUSED PHYSICAL EXAM:  unable to complete  Diet Order:   Diet Order            Diet regular Room service appropriate? Yes; Fluid consistency: Thin  Diet effective now                 EDUCATION NEEDS:   No education needs have been identified at this time  Skin:  Skin Assessment: Reviewed RN Assessment  Last BM:  6/25  Height:   Ht Readings from Last 1 Encounters:  01/23/20 5\' 3"  (1.6 m)    Weight:   Wt Readings from Last 1 Encounters:  01/23/20 60.7 kg    Ideal Body Weight:  52.3 kg  BMI:  Body mass index is 23.71 kg/m.  Estimated Nutritional Needs:   Kcal:  1400-1600  Protein:  65-75 gm  Fluid:  >/= 1.5 L    Lucas Mallow, RD, LDN, CNSC Please refer to Amion for contact information.

## 2020-01-25 NOTE — Progress Notes (Signed)
Progress Note for [facility]  Subjective: No reports of any bleeding.  Objective: Vital signs in last 24 hours: Temp:  [97.6 F (36.4 C)-98.2 F (36.8 C)] 97.6 F (36.4 C) (06/27 0517) Pulse Rate:  [76-79] 79 (06/27 0517) Resp:  [14-19] 18 (06/27 0517) BP: (120-134)/(65-81) 130/81 (06/27 0517) SpO2:  [99 %-100 %] 99 % (06/27 0517) Last BM Date: 01/23/20  Intake/Output from previous day: 06/26 0701 - 06/27 0700 In: 240 [P.O.:240] Out: -  Intake/Output this shift: No intake/output data recorded.  General appearance: alert and no distress GI: soft, non-tender; bowel sounds normal; no masses,  no organomegaly  Lab Results: Recent Labs    01/23/20 0828 01/23/20 0828 01/23/20 1626 01/24/20 0418 01/25/20 0439  WBC 4.6  --   --  6.8 5.5  HGB 6.7*   < > 9.0* 9.5* 8.6*  HCT 22.0*   < > 28.8* 29.9* 27.9*  PLT 123*  --   --  115* 144*   < > = values in this interval not displayed.   BMET Recent Labs    01/22/20 2021 01/23/20 0754  NA 138 141  K 3.3* 3.3*  CL 103 111  CO2 25 25  GLUCOSE 139* 111*  BUN 31* 20  CREATININE 1.16* 1.03*  CALCIUM 8.5* 7.7*   LFT Recent Labs    01/22/20 2021  PROT 6.0*  ALBUMIN 3.5  AST 24  ALT 20  ALKPHOS 51  BILITOT 0.4   PT/INR No results for input(s): LABPROT, INR in the last 72 hours. Hepatitis Panel No results for input(s): HEPBSAG, HCVAB, HEPAIGM, HEPBIGM in the last 72 hours. C-Diff No results for input(s): CDIFFTOX in the last 72 hours. Fecal Lactopherrin No results for input(s): FECLLACTOFRN in the last 72 hours.  Studies/Results: No results found.  Medications:  Scheduled: . feeding supplement (ENSURE ENLIVE)  237 mL Oral Q24H  . multivitamin with minerals  1 tablet Oral Daily  . pantoprazole (PROTONIX) IV  40 mg Intravenous Q12H  . polyethylene glycol powder  0.5 Container Oral Once   Continuous:   Assessment/Plan: 1) GI bleed - ? Source. 2) Anemia.   Her HGB did drop, but there are no signs of  bleeding.  This is most likely equilibration, however, it will be prudent to monitor her.  The VCE was completed, but the software to create the video no malfunctioning.  The hope is to have a functioning computer tomorrow.  Plan: 1) Follow HGB and transfuse as necessary. 2) ? VCE results tomorrow. 3) [facility] to resume care in the AM.  LOS: 3 days   Marty Sadlowski D 01/25/2020, 9:55 AM

## 2020-01-25 NOTE — Progress Notes (Signed)
Capsule monitor and leads removed and placed in patient bag for pick up in the a.m.

## 2020-01-25 NOTE — Progress Notes (Signed)
PROGRESS NOTE    Lori Rush  ZOX:[id number] DOB: 04/02/1946 DOA: 01/22/2020 PCP: Glendon Axe, MD   Brief Narrative: Lori Rush is a 74 y.o. female with history of hypertension, CKD stage III, hyperlipidemia.  Patient presented secondary to weakness and was found to have a hemoglobin of 6.2. She is admitted for possible upper GI bleeding. She received blood transfusion. GI consulted for management.   Assessment & Plan:   Principal Problem:   Acute GI bleeding Active Problems:   Hypertension   CKD (chronic kidney disease) stage 2, GFR 60-89 ml/min   Acute blood loss anemia   Acute GI bleeding Acute blood loss anemia Iron deficiency anemia Recurrent issue.  Patient has a recent colonoscopy which was performed at an outside hospital.  Per patient, GI has requested records.  Patient has received 2 units of PRBC to date.  No recurrent GI bleeding noted.  Patient is on iron supplementation as an outpatient.  Hemoglobin improved from 6.2 to 6.7 posttransfusion initially with a peak of 9.5.  -GI recommendations: PPI, need to read capsule endoscopy prior to discharge -Daily CBC  Essential hypertension Patient is on amlodipine, losartan, metoprolol as an outpatient.  Blood pressures mildly uncontrolled while inpatient.  Antihypertensives were held secondary to concern for GI bleeding.  As needed labetalol was ordered on admission. -Continue labetalol 10 mg IV as needed  CKD stage IIIa Stable.  Hyperlipidemia Patient is on Lipitor 20 mg daily as an outpatient   DVT prophylaxis: SCDs secondary to GI bleed Code Status:   Code Status: Full Code Family Communication: None at bedside Disposition Plan: Anticipate patient will discharge home in 1 day pending results of capsule endoscopy   Consultants:   [facility]  Procedures:   None  Antimicrobials:  None   Subjective: No bowel movements overnight. No hematochezia.  Objective: Vitals:   01/24/20 0401 01/24/20 1336  01/24/20 2000 01/25/20 0517  BP: 128/75 120/70 134/65 130/81  Pulse: 75 77 76 79  Resp: 18 14 19 18   Temp: (!) 97.5 F (36.4 C) 98.2 F (36.8 C) 97.8 F (36.6 C) 97.6 F (36.4 C)  TempSrc: Oral Oral Oral Oral  SpO2: 97% 99% 100% 99%  Weight:      Height:        Intake/Output Summary (Last 24 hours) at 01/25/2020 1307 Last data filed at 01/25/2020 1000 Gross per 24 hour  Intake 720 ml  Output -  Net 720 ml   Filed Weights   01/23/20 1812  Weight: 60.7 kg    Examination:  General exam: Appears calm and comfortable Respiratory system: Clear to auscultation. Respiratory effort normal. Cardiovascular system: S1 & S2 heard, RRR. Systolic murmur Gastrointestinal system: Abdomen is nondistended, soft and nontender. No organomegaly or masses felt. Normal bowel sounds heard. Central nervous system: Alert and oriented. No focal neurological deficits. Musculoskeletal: No edema. No calf tenderness Skin: No cyanosis. No rashes Psychiatry: Judgement and insight appear normal. Mood & affect appropriate.    Data Reviewed: I have personally reviewed following labs and imaging studies  CBC Lab Results  Component Value Date   WBC 5.5 01/25/2020   RBC 3.22 (L) 01/25/2020   HGB 8.6 (L) 01/25/2020   HCT 27.9 (L) 01/25/2020   MCV 86.6 01/25/2020   MCH 26.7 01/25/2020   PLT 144 (L) 01/25/2020   MCHC 30.8 01/25/2020   RDW 16.2 (H) 01/25/2020   LYMPHSABS 2.4 01/22/2020   MONOABS 0.7 01/22/2020   EOSABS 0.1 01/22/2020   BASOSABS 0.0 01/22/2020  Last metabolic panel Lab Results  Component Value Date   NA 141 01/23/2020   K 3.3 (L) 01/23/2020   CL 111 01/23/2020   CO2 25 01/23/2020   BUN 20 01/23/2020   CREATININE 1.03 (H) 01/23/2020   GLUCOSE 111 (H) 01/23/2020   GFRNONAA 53 (L) 01/23/2020   GFRAA >60 01/23/2020   CALCIUM 7.7 (L) 01/23/2020   PROT 6.0 (L) 01/22/2020   ALBUMIN 3.5 01/22/2020   BILITOT 0.4 01/22/2020   ALKPHOS 51 01/22/2020   AST 24 01/22/2020   ALT 20  01/22/2020   ANIONGAP 5 01/23/2020    CBG (last 3)  Recent Labs    01/24/20 1340 01/24/20 2335 01/25/20 0822  GLUCAP 144* 176* 100*     GFR: Estimated Creatinine Clearance: 38.4 mL/min (A) (by C-G formula based on SCr of 1.03 mg/dL (H)).  Coagulation Profile: No results for input(s): INR, PROTIME in the last 168 hours.  Recent Results (from the past 240 hour(s))  SARS Coronavirus 2 by RT PCR (hospital order, performed in [facility] hospital lab) Nasopharyngeal Nasopharyngeal Swab     Status: None   Collection Time: 01/23/20  2:00 AM   Specimen: Nasopharyngeal Swab  Result Value Ref Range Status   SARS Coronavirus 2 NEGATIVE NEGATIVE Final    Comment: (NOTE) SARS-CoV-2 target nucleic acids are NOT DETECTED.  The SARS-CoV-2 RNA is generally detectable in upper and lower respiratory specimens during the acute phase of infection. The lowest concentration of SARS-CoV-2 viral copies this assay can detect is 250 copies / mL. A negative result does not preclude SARS-CoV-2 infection and should not be used as the sole basis for treatment or other patient management decisions.  A negative result may occur with improper specimen collection / handling, submission of specimen other than nasopharyngeal swab, presence of viral mutation(s) within the areas targeted by this assay, and inadequate number of viral copies (<250 copies / mL). A negative result must be combined with clinical observations, patient history, and epidemiological information.  Fact Sheet for Patients:   StrictlyIdeas.no  Fact Sheet for Healthcare Providers: BankingDealers.co.za  This test is not yet approved or  cleared by the [place] FDA and has been authorized for detection and/or diagnosis of SARS-CoV-2 by FDA under an Emergency Use Authorization (EUA).  This EUA will remain in effect (meaning this test can be used) for the duration of the COVID-19  declaration under Section 564(b)(1) of the Act, 21 U.S.C. section 360bbb-3(b)(1), unless the authorization is terminated or revoked sooner.  Performed at [facility], [place] [address] [place], [place] [address]         Radiology Studies: No results found.      Scheduled Meds: . feeding supplement (ENSURE ENLIVE)  237 mL Oral Q24H  . multivitamin with minerals  1 tablet Oral Daily  . pantoprazole (PROTONIX) IV  40 mg Intravenous Q12H  . polyethylene glycol powder  0.5 Container Oral Once   Continuous Infusions:    LOS: 3 days     Cordelia Poche, MD [facility] 01/25/2020, 1:07 PM  If 7PM-7AM, please contact night-coverage [email or web address]

## 2020-01-26 ENCOUNTER — Encounter (HOSPITAL_COMMUNITY): Payer: Self-pay | Admitting: Gastroenterology

## 2020-01-26 DIAGNOSIS — A048 Other specified bacterial intestinal infections: Secondary | ICD-10-CM

## 2020-01-26 DIAGNOSIS — R195 Other fecal abnormalities: Secondary | ICD-10-CM

## 2020-01-26 LAB — CBC
HCT: 27.1 % — ABNORMAL LOW (ref 36.0–46.0)
Hemoglobin: 8.4 g/dL — ABNORMAL LOW (ref 12.0–15.0)
MCH: 26.8 pg (ref 26.0–34.0)
MCHC: 31 g/dL (ref 30.0–36.0)
MCV: 86.6 fL (ref 80.0–100.0)
Platelets: 139 10*3/uL — ABNORMAL LOW (ref 150–400)
RBC: 3.13 MIL/uL — ABNORMAL LOW (ref 3.87–5.11)
RDW: 16 % — ABNORMAL HIGH (ref 11.5–15.5)
WBC: 5.6 10*3/uL (ref 4.0–10.5)
nRBC: 0 % (ref 0.0–0.2)

## 2020-01-26 LAB — GLUCOSE, CAPILLARY: Glucose-Capillary: 124 mg/dL — ABNORMAL HIGH (ref 70–99)

## 2020-01-26 MED ORDER — PANTOPRAZOLE SODIUM 40 MG PO TBEC
DELAYED_RELEASE_TABLET | ORAL | 0 refills | Status: AC
Start: 2020-01-26 — End: 2024-03-10

## 2020-01-26 MED ORDER — PYLERA 140-125-125 MG PO CAPS
3.0000 | ORAL_CAPSULE | Freq: Three times a day (TID) | ORAL | 0 refills | Status: DC
Start: 2020-01-26 — End: 2020-03-26

## 2020-01-26 NOTE — Procedures (Signed)
Full report with photos will be imported/scanned   A few gastric erosions in stomach as known by EGD prior Normal small bowel  No clear sorce of blood loss, anemia though gastritis could cause heme + stool

## 2020-01-26 NOTE — Discharge Summary (Signed)
Physician Discharge Summary  Lori Rush IRS:[id number] DOB: 1946-01-23 DOA: 01/22/2020  PCP: Glendon Axe, MD  Admit date: 01/22/2020 Discharge date: 01/26/2020  Admitted From: Home Disposition: Home  Recommendations for Outpatient Follow-up:  1. Follow up with PCP in 1 week 2. Follow up with GI physician 3. Please obtain BMP/CBC in one week 4. Please follow up on the following pending results: None  Home Health: None Equipment/Devices: None  Discharge Condition: Stable CODE STATUS: Full code Diet recommendation: Heart healthy   Brief/Interim Summary:  Admission HPI written by [name], MD   Chief Complaint: Weakness.  HPI: Lori Rush is a 74 y.o. female with history of hypertension chronic and disease stage II was admitted in March 2021 3 months ago for symptomatic anemia at that time patient was transfused and discharged home for follow-up with GI as outpatient.  Patient has had an EGD 4 weeks ago and a colonoscopy 2 weeks ago and as per the patient EGD showed some bacteria and was advised that she will need antibiotics colonoscopy was fine as per the patient.  Reports are not available to [place].  Over the last 3 days patient has become very weak with exertional fatigue and shortness of breath.  Had gone to her PCP today was found to be hypotensive and was advised to come to the ER.  Patient has been taking iron pills since patient was diagnosed with symptomatic anemia 3 months ago with low ferritin.  But last 2 days patient has noticed black stools.  Denies any abdominal pain chest pain vomiting or diarrhea or fever chills.  Patient denies taking any NSAIDs or aspirin.  ED Course: In the ER patient blood pressure was more than 009 systolic EKG is pending.  Stool for occult blood was positive and melanotic.  Labs show hemoglobin of 6.2 with hematocrit of 20.7 creatinine at baseline of 1.1 and potassium 3.3.  Patient has 2 units of PRBC ordered along with Protonix  infusion started and on-call [facility] gastroenterologist Dr. Hilarie Fredrickson was consulted and will be seeing patient in consult.    Hospital course:  Acute GI bleeding Acute blood loss anemia Iron deficiency anemia H. Pylori infection/Gastritis Recurrent issue.  Patient has a recent colonoscopy which was performed at an outside hospital.  Per patient, GI has requested records with EGD significant for gastritis and H. Pylori positive biopsy.  Patient has received 2 units of PRBC to date.  No recurrent GI bleeding noted.  Patient is on iron supplementation as an outpatient.  Capsule endoscopy performed on 6/26 and was negative for GI bleed. Hemoglobin improved from 6.2 to 6.7 posttransfusion initially with a peak of 9.5. Stable at 8.4 prior to discharge with normal colored stool. Recommendations for 10 day treatment of H. Pylori with Pylera in addition to Protonix 40 mg BID x14 days followed by daily dosing. Patient needs repeat CBC in one week and GI follow-up with her primary gastroenterologist.  Essential hypertension Patient is on amlodipine, losartan, metoprolol as an outpatient.  Blood pressures mildly uncontrolled while inpatient.  Antihypertensives were held secondary to concern for GI bleeding.  As needed labetalol was ordered on admission. Resume home medications.  CKD stage IIIa Stable.  Hyperlipidemia Patient is on Lipitor 20 mg daily as an outpatient  Discharge Diagnoses:  Principal Problem:   Acute GI bleeding Active Problems:   Hypertension   CKD (chronic kidney disease) stage 2, GFR 60-89 ml/min   Acute blood loss anemia   Heme + stool  Dark stools    Discharge Instructions   Allergies as of 01/26/2020   No Known Allergies     Medication List    TAKE these medications   amLODipine 5 MG tablet Commonly known as: NORVASC Take 5 mg by mouth daily.   atorvastatin 20 MG tablet Commonly known as: LIPITOR Take 20 mg by mouth at bedtime.   docusate sodium 100 MG  capsule Commonly known as: Colace Take 2 capsules (200 mg total) by mouth daily.   fluticasone 50 MCG/ACT nasal spray Commonly known as: FLONASE Place 1 spray into both nostrils daily as needed for allergies or rhinitis.   Iron (Ferrous Sulfate) 325 (65 Fe) MG Tabs Take 325 mg by mouth in the morning and at bedtime.   losartan 100 MG tablet Commonly known as: COZAAR Take 100 mg by mouth daily.   metoprolol tartrate 100 MG tablet Commonly known as: LOPRESSOR Take 100 mg by mouth 2 (two) times daily.   ondansetron 4 MG tablet Commonly known as: ZOFRAN Take 1 tablet (4 mg total) by mouth every 6 (six) hours as needed for nausea.   pantoprazole 40 MG tablet Commonly known as: Protonix Take 1 tablet (40 mg total) by mouth 2 (two) times daily for 14 days, THEN 1 tablet (40 mg total) daily. Start taking on: January 26, 2020   Pylera [phone] MG capsule Generic drug: bismuth-metronidazole-tetracycline Take 3 capsules by mouth 4 (four) times daily -  before meals and at bedtime for 10 days.   vitamin C 250 MG tablet Commonly known as: ASCORBIC ACID Take 1 tablet (250 mg total) by mouth 2 (two) times daily.       Follow-up Information    Glendon Axe, MD. Schedule an appointment as soon as possible for a visit in 1 week(s).   Specialty: Family Medicine Contact information: [address] [place] [address] [phone]        [facility] Gastroenterologist. Schedule an appointment as soon as possible for a visit.   Why: GI bleeding             No Known Allergies  Consultations:  [facility]   Procedures/Studies:  No results found.    Subjective: No issues. Had a normal colored brown stool today.  Discharge Exam: Vitals:   01/25/20 2034 01/26/20 0601  BP: 135/73 (!) 155/84  Pulse: 82 74  Resp: 20 19  Temp: 98.1 F (36.7 C) 97.7 F (36.5 C)  SpO2: 99% 99%   Vitals:   01/25/20 0517 01/25/20 1337 01/25/20 2034 01/26/20 0601  BP:  130/81 123/74 135/73 (!) 155/84  Pulse: 79 95 82 74  Resp: 18 18 20 19   Temp: 97.6 F (36.4 C) 98.6 F (37 C) 98.1 F (36.7 C) 97.7 F (36.5 C)  TempSrc: Oral Oral Oral Oral  SpO2: 99% 100% 99% 99%  Weight:      Height:        General: Pt is alert, awake, not in acute distress Cardiovascular: RRR, S1/S2 +, no rubs, no gallops Respiratory: CTA bilaterally, no wheezing, no rhonchi Abdominal: Soft, NT, ND, bowel sounds + Extremities: no edema, no cyanosis    The results of significant diagnostics from this hospitalization (including imaging, microbiology, ancillary and laboratory) are listed below for reference.     Microbiology: Recent Results (from the past 240 hour(s))  SARS Coronavirus 2 by RT PCR (hospital order, performed in [facility] hospital lab) Nasopharyngeal Nasopharyngeal Swab     Status: None  Collection Time: 01/23/20  2:00 AM   Specimen: Nasopharyngeal Swab  Result Value Ref Range Status   SARS Coronavirus 2 NEGATIVE NEGATIVE Final    Comment: (NOTE) SARS-CoV-2 target nucleic acids are NOT DETECTED.  The SARS-CoV-2 RNA is generally detectable in upper and lower respiratory specimens during the acute phase of infection. The lowest concentration of SARS-CoV-2 viral copies this assay can detect is 250 copies / mL. A negative result does not preclude SARS-CoV-2 infection and should not be used as the sole basis for treatment or other patient management decisions.  A negative result may occur with improper specimen collection / handling, submission of specimen other than nasopharyngeal swab, presence of viral mutation(s) within the areas targeted by this assay, and inadequate number of viral copies (<250 copies / mL). A negative result must be combined with clinical observations, patient history, and epidemiological information.  Fact Sheet for Patients:   StrictlyIdeas.no  Fact Sheet for Healthcare  Providers: BankingDealers.co.za  This test is not yet approved or  cleared by the [place] FDA and has been authorized for detection and/or diagnosis of SARS-CoV-2 by FDA under an Emergency Use Authorization (EUA).  This EUA will remain in effect (meaning this test can be used) for the duration of the COVID-19 declaration under Section 564(b)(1) of the Act, 21 U.S.C. section 360bbb-3(b)(1), unless the authorization is terminated or revoked sooner.  Performed at [facility], [place] [address], [place], [place] [address]      Labs: BNP (last 3 results) No results for input(s): BNP in the last 8760 hours. Basic Metabolic Panel: Recent Labs  Lab 01/22/20 2021 01/23/20 0754  NA 138 141  K 3.3* 3.3*  CL 103 111  CO2 25 25  GLUCOSE 139* 111*  BUN 31* 20  CREATININE 1.16* 1.03*  CALCIUM 8.5* 7.7*   Liver Function Tests: Recent Labs  Lab 01/22/20 2021  AST 24  ALT 20  ALKPHOS 51  BILITOT 0.4  PROT 6.0*  ALBUMIN 3.5   No results for input(s): LIPASE, AMYLASE in the last 168 hours. No results for input(s): AMMONIA in the last 168 hours. CBC: Recent Labs  Lab 01/22/20 2021 01/22/20 2021 01/23/20 0828 01/23/20 1626 01/24/20 0418 01/25/20 0439 01/26/20 0436  WBC 7.4  --  4.6  --  6.8 5.5 5.6  NEUTROABS 4.3  --   --   --   --   --   --   HGB 6.2*   < > 6.7* 9.0* 9.5* 8.6* 8.4*  HCT 20.7*   < > 22.0* 28.8* 29.9* 27.9* 27.1*  MCV 84.1  --  86.6  --  85.2 86.6 86.6  PLT 163  --  123*  --  115* 144* 139*   < > = values in this interval not displayed.   Cardiac Enzymes: No results for input(s): CKTOTAL, CKMB, CKMBINDEX, TROPONINI in the last 168 hours. BNP: Invalid input(s): POCBNP CBG: Recent Labs  Lab 01/24/20 0619 01/24/20 1340 01/24/20 2335 01/25/20 0822 01/25/20 2339  GLUCAP 155* 144* 176* 100* 127*   D-Dimer No results for input(s): DDIMER in the last 72 hours. Hgb A1c No results for input(s): HGBA1C in the  last 72 hours. Lipid Profile No results for input(s): CHOL, HDL, LDLCALC, TRIG, CHOLHDL, LDLDIRECT in the last 72 hours. Thyroid function studies No results for input(s): TSH, T4TOTAL, T3FREE, THYROIDAB in the last 72 hours.  Invalid input(s): FREET3 Anemia work up No results for input(s): VITAMINB12, FOLATE, FERRITIN, TIBC, IRON, RETICCTPCT in  the last 72 hours. Urinalysis    Component Value Date/Time   COLORURINE STRAW (A) 01/22/2020 2130   APPEARANCEUR CLEAR 01/22/2020 2130   LABSPEC 1.009 01/22/2020 2130   PHURINE 6.0 01/22/2020 2130   GLUCOSEU NEGATIVE 01/22/2020 2130   HGBUR NEGATIVE 01/22/2020 2130   BILIRUBINUR NEGATIVE 01/22/2020 2130   KETONESUR NEGATIVE 01/22/2020 2130   PROTEINUR NEGATIVE 01/22/2020 2130   NITRITE NEGATIVE 01/22/2020 2130   LEUKOCYTESUR NEGATIVE 01/22/2020 2130   Sepsis Labs Invalid input(s): PROCALCITONIN,  WBC,  LACTICIDVEN Microbiology Recent Results (from the past 240 hour(s))  SARS Coronavirus 2 by RT PCR (hospital order, performed in [facility] hospital lab) Nasopharyngeal Nasopharyngeal Swab     Status: None   Collection Time: 01/23/20  2:00 AM   Specimen: Nasopharyngeal Swab  Result Value Ref Range Status   SARS Coronavirus 2 NEGATIVE NEGATIVE Final    Comment: (NOTE) SARS-CoV-2 target nucleic acids are NOT DETECTED.  The SARS-CoV-2 RNA is generally detectable in upper and lower respiratory specimens during the acute phase of infection. The lowest concentration of SARS-CoV-2 viral copies this assay can detect is 250 copies / mL. A negative result does not preclude SARS-CoV-2 infection and should not be used as the sole basis for treatment or other patient management decisions.  A negative result may occur with improper specimen collection / handling, submission of specimen other than nasopharyngeal swab, presence of viral mutation(s) within the areas targeted by this assay, and inadequate number of viral copies (<250 copies / mL). A  negative result must be combined with clinical observations, patient history, and epidemiological information.  Fact Sheet for Patients:   StrictlyIdeas.no  Fact Sheet for Healthcare Providers: BankingDealers.co.za  This test is not yet approved or  cleared by the [place] FDA and has been authorized for detection and/or diagnosis of SARS-CoV-2 by FDA under an Emergency Use Authorization (EUA).  This EUA will remain in effect (meaning this test can be used) for the duration of the COVID-19 declaration under Section 564(b)(1) of the Act, 21 U.S.C. section 360bbb-3(b)(1), unless the authorization is terminated or revoked sooner.  Performed at [facility], [place] [address], [place], [place] [address]      Time coordinating discharge: 35 minutes  SIGNED:   Cordelia Poche, MD [facility] 01/26/2020, 2:19 PM

## 2020-01-26 NOTE — Progress Notes (Addendum)
Patient ID: Lori Rush, female   DOB: [birth year], 74 y.o.   MRN: [id number]    Progress Note   Subjective   Day # 4  UX:[id number] and anemia/recurrent  Capsule endoscopy completed - unable to be read as equipment malfunction - hopefully issue to be resolved today   HGB 8.4 today  Down from 9.5  On 6/26- was 6.7 on admit  Pt feels okay, denies any weakness or lightheadedness.  No complaints of abdominal pain.  Says she is having bowel movements which are very small, no melena. She has not passed capsule as yet.     Objective   Vital signs in last 24 hours: Temp:  [97.7 F (36.5 C)-98.6 F (37 C)] 97.7 F (36.5 C) (06/28 0601) Pulse Rate:  [74-95] 74 (06/28 0601) Resp:  [18-20] 19 (06/28 0601) BP: (123-155)/(73-84) 155/84 (06/28 0601) SpO2:  [99 %-100 %] 99 % (06/28 0601) Last BM Date: 01/22/20 General:    Elderly AA female in NAD Heart:  Regular rate and rhythm; no murmurs Lungs: Respirations even and unlabored, lungs CTA bilaterally Abdomen:  Soft, nontender and nondistended. Normal bowel sounds. Extremities:  Without edema. Neurologic:  Alert and oriented,  grossly normal neurologically. Psych:  Cooperative. Normal mood and affect.  Intake/Output from previous day: 06/27 0701 - 06/28 0700 In: 960 [P.O.:960] Out: -  Intake/Output this shift: No intake/output data recorded.  Lab Results: Recent Labs    01/24/20 0418 01/25/20 0439 01/26/20 0436  WBC 6.8 5.5 5.6  HGB 9.5* 8.6* 8.4*  HCT 29.9* 27.9* 27.1*  PLT 115* 144* 139*   BMET No results for input(s): NA, K, CL, CO2, GLUCOSE, BUN, CREATININE, CALCIUM in the last 72 hours. LFT No results for input(s): PROT, ALBUMIN, AST, ALT, ALKPHOS, BILITOT, BILIDIR, IBILI in the last 72 hours. PT/INR No results for input(s): LABPROT, INR in the last 72 hours.  Studies/Results: No results found.     Assessment / Plan:    #33 74 year old African-American female with symptomatic iron deficiency anemia, hemoglobin in  the 6 range and recently heme positive without overt melena. Recent colonoscopy negative and EGD March 2021 mild gastritis and positive for H. Pylori.-Not treated as yet  Capsule endoscopy 01/24/2020-had been unable to read due to equipment malfunction.  Hemoglobin relatively stable with mild drift since transfusions.  Plan; capsule is being read this afternoon, should have results shortly  We will need to continue to trend hemoglobin, if discharged later today or in a.m. will need follow-up hemoglobin later this week Continue PPI Start Rx for H. pylori on discharge   Addendum-Capsule endoscopy is  Negative  Okay for patient to be discharged home, she has a gastroenterologist at [facility] Will need follow-up there, and arrangement for follow-up hemoglobin in 5 to 7 days Please start Pylera  As directed x 10 days for Hpylori BID Protonix 40 mg x 2 weeks, then daily long term GI will sign off, thank you         Principal Problem:   Acute GI bleeding Active Problems:   Hypertension   CKD (chronic kidney disease) stage 2, GFR 60-89 ml/min   Acute blood loss anemia     LOS: 4 days   Amy Esterwood  01/26/2020, 8:54 AM    Attending physician's note   I have taken an interval history, reviewed the chart and examined the patient. I agree with the Advanced Practitioner's note, impression and recommendations.   Damaris Hippo , MD [phone]

## 2020-01-26 NOTE — Discharge Instructions (Signed)
Gastrointestinal Bleeding Gastrointestinal (GI) bleeding is bleeding somewhere along the digestive tract, between the mouth and the anus. This tract includes the mouth, esophagus, stomach, small intestine, large intestine, and anus. The large intestine is often called the colon. GI bleeding can be caused by various problems. The severity of these problems can range from mild to serious or even life-threatening. If you have GI bleeding, you may find blood in your stools (feces), you may have black stools, or you may vomit blood. If there is a lot of bleeding, you may need to stay in the hospital. What are the causes? This condition may be caused by:  Inflammation, irritation, or swelling of the esophagus (esophagitis). The esophagus is part of the body that moves food from your mouth to your stomach.  Swollen veins in the rectum (hemorrhoids).  Areas of painful tearing in the anus that are often caused by passing hard stool (anal fissures).  Pouches that form on the colon over time, with age, and may bleed a lot (diverticulosis).  Inflammation (diverticulitis) in areas with diverticulosis. This can cause pain, fever, and bloody stools, although bleeding may be mild.  Growths (polyps) or cancer. Colon cancer often starts out as precancerous polyps.  Gastritis and ulcers. With these, bleeding may come from the upper GI tract, near the stomach. What increases the risk? You are more likely to develop this condition if you:  Have an infection in your stomach from a type of bacteria called Helicobacter pylori.  Take certain medicines, such as: ? NSAIDs. ? Aspirin. ? Selective serotonin reuptake inhibitors (SSRIs). ? Steroids. ? Antiplatelet or anticoagulant medicines.  Smoke.  Drink alcohol. What are the signs or symptoms? Common symptoms of this condition include:  Bright red blood in your vomit, or vomit that looks like coffee grounds.  Bloody, black, or tarry stools. ? Bleeding  from the lower GI tract will usually cause red or maroon blood in the stools. ? Bleeding from the upper GI tract may cause black, tarry stools that are often stronger smelling than usual. ? In certain cases, if the bleeding is fast enough, the stools may be red.  Pain or cramping in the abdomen. How is this diagnosed? This condition may be diagnosed based on:  Your medical history and a physical exam.  Various tests, such as: ? Blood tests. ? Stool tests. ? X-rays and other imaging tests. ? Esophagogastroduodenoscopy (EGD). In this test, a flexible, lighted tube is used to look at your esophagus, stomach, and small intestine. ? Colonoscopy. In this test, a flexible, lighted tube is used to look at your colon. How is this treated? Treatment for this condition depends on the cause of the bleeding. For example:  For bleeding from the esophagus, stomach, small intestine, or colon, the health care provider doing your EGD or colonoscopy may be able to stop the bleeding as part of the procedure.  Inflammation or infection of the colon can be treated with medicines.  Certain rectal problems can be treated with creams, suppositories, or warm baths.  Medicines may be given to reduce acid in your stomach.  Surgery is sometimes needed.  Blood transfusions are sometimes needed if a lot of blood has been lost. If bleeding is mild, you may be allowed to go home. If there is a lot of bleeding, you will need to stay in the hospital for observation. Follow these instructions at home:   Take over-the-counter and prescription medicines only as told by your health care provider.    Eat foods that are high in fiber, such as beans, whole grains, and fresh fruits and vegetables. This will help to keep your stools soft. Eating 1-3 prunes each day works well for many people.  Drink enough fluid to keep your urine pale yellow.  Keep all follow-up visits as told by your health care provider. This is  important. Contact a health care provider if:  Your symptoms do not improve. Get help right away if:  Your bleeding does not stop.  You feel light-headed or you faint.  You feel weak.  You have severe cramps in your back or abdomen.  You pass large blood clots in your stool.  Your symptoms are getting worse.  You have chest pain or fast heartbeats. Summary  Gastrointestinal (GI) bleeding is bleeding somewhere along the digestive tract, between the mouth and anus. GI bleeding can be caused by various problems. The severity of these problems can range from mild to serious or even life-threatening.  Treatment for this condition depends on the cause of the bleeding.  Take over-the-counter and prescription medicines only as told by your health care provider.  Keep all follow-up visits as told by your health care provider. This is important.  Get help right away if your bleeding increases, your symptoms are getting worse, or you have new symptoms. This information is not intended to replace advice given to you by your health care provider. Make sure you discuss any questions you have with your health care provider. Document Revised: 02/27/2018 Document Reviewed: 02/27/2018 Elsevier Patient Education  2020 [employer]  

## 2020-03-25 ENCOUNTER — Other Ambulatory Visit: Payer: Self-pay

## 2020-03-25 ENCOUNTER — Encounter (HOSPITAL_COMMUNITY): Payer: Self-pay | Admitting: *Deleted

## 2020-03-25 DIAGNOSIS — Z8719 Personal history of other diseases of the digestive system: Secondary | ICD-10-CM

## 2020-03-25 DIAGNOSIS — D259 Leiomyoma of uterus, unspecified: Secondary | ICD-10-CM | POA: Diagnosis present

## 2020-03-25 DIAGNOSIS — Z79899 Other long term (current) drug therapy: Secondary | ICD-10-CM

## 2020-03-25 DIAGNOSIS — K5790 Diverticulosis of intestine, part unspecified, without perforation or abscess without bleeding: Secondary | ICD-10-CM | POA: Diagnosis present

## 2020-03-25 DIAGNOSIS — C49A2 Gastrointestinal stromal tumor of stomach: Principal | ICD-10-CM | POA: Diagnosis present

## 2020-03-25 DIAGNOSIS — Z833 Family history of diabetes mellitus: Secondary | ICD-10-CM

## 2020-03-25 DIAGNOSIS — D62 Acute posthemorrhagic anemia: Secondary | ICD-10-CM | POA: Diagnosis present

## 2020-03-25 DIAGNOSIS — R634 Abnormal weight loss: Secondary | ICD-10-CM | POA: Diagnosis present

## 2020-03-25 DIAGNOSIS — Z20822 Contact with and (suspected) exposure to covid-19: Secondary | ICD-10-CM | POA: Diagnosis present

## 2020-03-25 DIAGNOSIS — K449 Diaphragmatic hernia without obstruction or gangrene: Secondary | ICD-10-CM | POA: Diagnosis present

## 2020-03-25 DIAGNOSIS — E876 Hypokalemia: Secondary | ICD-10-CM | POA: Diagnosis present

## 2020-03-25 DIAGNOSIS — R911 Solitary pulmonary nodule: Secondary | ICD-10-CM | POA: Diagnosis present

## 2020-03-25 DIAGNOSIS — N179 Acute kidney failure, unspecified: Secondary | ICD-10-CM | POA: Diagnosis present

## 2020-03-25 DIAGNOSIS — Z8249 Family history of ischemic heart disease and other diseases of the circulatory system: Secondary | ICD-10-CM

## 2020-03-25 DIAGNOSIS — Z6823 Body mass index (BMI) 23.0-23.9, adult: Secondary | ICD-10-CM

## 2020-03-25 DIAGNOSIS — N182 Chronic kidney disease, stage 2 (mild): Secondary | ICD-10-CM | POA: Diagnosis present

## 2020-03-25 DIAGNOSIS — I129 Hypertensive chronic kidney disease with stage 1 through stage 4 chronic kidney disease, or unspecified chronic kidney disease: Secondary | ICD-10-CM | POA: Diagnosis present

## 2020-03-25 DIAGNOSIS — D509 Iron deficiency anemia, unspecified: Secondary | ICD-10-CM | POA: Diagnosis present

## 2020-03-25 DIAGNOSIS — R001 Bradycardia, unspecified: Secondary | ICD-10-CM | POA: Diagnosis not present

## 2020-03-25 LAB — COMPREHENSIVE METABOLIC PANEL
ALT: 16 U/L (ref 0–44)
AST: 23 U/L (ref 15–41)
Albumin: 3.5 g/dL (ref 3.5–5.0)
Alkaline Phosphatase: 59 U/L (ref 38–126)
Anion gap: 8 (ref 5–15)
BUN: 16 mg/dL (ref 8–23)
CO2: 25 mmol/L (ref 22–32)
Calcium: 8.7 mg/dL — ABNORMAL LOW (ref 8.9–10.3)
Chloride: 106 mmol/L (ref 98–111)
Creatinine, Ser: 1.45 mg/dL — ABNORMAL HIGH (ref 0.44–1.00)
GFR calc Af Amer: 40 mL/min — ABNORMAL LOW (ref >60)
GFR calc non Af Amer: 35 mL/min — ABNORMAL LOW (ref >60)
Glucose, Bld: 140 mg/dL — ABNORMAL HIGH (ref 70–99)
Potassium: 3.1 mmol/L — ABNORMAL LOW (ref 3.5–5.1)
Sodium: 139 mmol/L (ref 135–145)
Total Bilirubin: 0.5 mg/dL (ref 0.3–1.2)
Total Protein: 6.9 g/dL (ref 6.5–8.1)

## 2020-03-25 LAB — CBC
HCT: 23.4 % — ABNORMAL LOW (ref 36.0–46.0)
Hemoglobin: 7 g/dL — ABNORMAL LOW (ref 12.0–15.0)
MCH: 25.5 pg — ABNORMAL LOW (ref 26.0–34.0)
MCHC: 29.9 g/dL — ABNORMAL LOW (ref 30.0–36.0)
MCV: 85.4 fL (ref 80.0–100.0)
Platelets: 224 10*3/uL (ref 150–400)
RBC: 2.74 MIL/uL — ABNORMAL LOW (ref 3.87–5.11)
RDW: 19.2 % — ABNORMAL HIGH (ref 11.5–15.5)
WBC: 6.1 10*3/uL (ref 4.0–10.5)
nRBC: 0.3 % — ABNORMAL HIGH (ref 0.0–0.2)

## 2020-03-25 NOTE — ED Triage Notes (Signed)
Pt states she is here for a blood transfusion. Reports she has had dark stools, had endoscopy and colonoscopy this morning. Say she just "feels weak"

## 2020-03-26 ENCOUNTER — Inpatient Hospital Stay (HOSPITAL_COMMUNITY): Payer: Medicaid Other

## 2020-03-26 ENCOUNTER — Inpatient Hospital Stay (HOSPITAL_COMMUNITY)
Admission: EM | Admit: 2020-03-26 | Discharge: 2020-04-01 | DRG: 327 | Disposition: A | Discharge status: Home / Self Care | Payer: Medicaid Other | Source: Ambulatory Visit | Attending: Internal Medicine | Admitting: Internal Medicine

## 2020-03-26 ENCOUNTER — Observation Stay (HOSPITAL_COMMUNITY): Payer: Medicaid Other | Admitting: Anesthesiology

## 2020-03-26 ENCOUNTER — Encounter (HOSPITAL_COMMUNITY): Payer: Self-pay | Admitting: Internal Medicine

## 2020-03-26 ENCOUNTER — Encounter (HOSPITAL_COMMUNITY)
Admission: EM | Disposition: A | Discharge status: Home / Self Care | Payer: Self-pay | Source: Ambulatory Visit | Attending: Internal Medicine

## 2020-03-26 DIAGNOSIS — I10 Essential (primary) hypertension: Secondary | ICD-10-CM | POA: Diagnosis not present

## 2020-03-26 DIAGNOSIS — Z01818 Encounter for other preprocedural examination: Secondary | ICD-10-CM

## 2020-03-26 DIAGNOSIS — N182 Chronic kidney disease, stage 2 (mild): Secondary | ICD-10-CM | POA: Diagnosis present

## 2020-03-26 DIAGNOSIS — D500 Iron deficiency anemia secondary to blood loss (chronic): Secondary | ICD-10-CM | POA: Diagnosis not present

## 2020-03-26 DIAGNOSIS — K3189 Other diseases of stomach and duodenum: Secondary | ICD-10-CM | POA: Diagnosis not present

## 2020-03-26 DIAGNOSIS — D649 Anemia, unspecified: Secondary | ICD-10-CM | POA: Diagnosis present

## 2020-03-26 DIAGNOSIS — I129 Hypertensive chronic kidney disease with stage 1 through stage 4 chronic kidney disease, or unspecified chronic kidney disease: Secondary | ICD-10-CM | POA: Diagnosis present

## 2020-03-26 DIAGNOSIS — E876 Hypokalemia: Secondary | ICD-10-CM | POA: Diagnosis present

## 2020-03-26 DIAGNOSIS — C49A2 Gastrointestinal stromal tumor of stomach: Secondary | ICD-10-CM | POA: Diagnosis present

## 2020-03-26 DIAGNOSIS — D490 Neoplasm of unspecified behavior of digestive system: Secondary | ICD-10-CM

## 2020-03-26 DIAGNOSIS — N179 Acute kidney failure, unspecified: Secondary | ICD-10-CM | POA: Diagnosis present

## 2020-03-26 DIAGNOSIS — K922 Gastrointestinal hemorrhage, unspecified: Secondary | ICD-10-CM

## 2020-03-26 DIAGNOSIS — Z79899 Other long term (current) drug therapy: Secondary | ICD-10-CM | POA: Diagnosis not present

## 2020-03-26 DIAGNOSIS — K5790 Diverticulosis of intestine, part unspecified, without perforation or abscess without bleeding: Secondary | ICD-10-CM | POA: Diagnosis present

## 2020-03-26 DIAGNOSIS — Z8249 Family history of ischemic heart disease and other diseases of the circulatory system: Secondary | ICD-10-CM | POA: Diagnosis not present

## 2020-03-26 DIAGNOSIS — Z833 Family history of diabetes mellitus: Secondary | ICD-10-CM | POA: Diagnosis not present

## 2020-03-26 DIAGNOSIS — K449 Diaphragmatic hernia without obstruction or gangrene: Secondary | ICD-10-CM | POA: Diagnosis present

## 2020-03-26 DIAGNOSIS — Z8719 Personal history of other diseases of the digestive system: Secondary | ICD-10-CM | POA: Diagnosis not present

## 2020-03-26 DIAGNOSIS — D259 Leiomyoma of uterus, unspecified: Secondary | ICD-10-CM | POA: Diagnosis present

## 2020-03-26 DIAGNOSIS — K921 Melena: Secondary | ICD-10-CM | POA: Diagnosis not present

## 2020-03-26 DIAGNOSIS — R634 Abnormal weight loss: Secondary | ICD-10-CM | POA: Diagnosis present

## 2020-03-26 DIAGNOSIS — R911 Solitary pulmonary nodule: Secondary | ICD-10-CM | POA: Diagnosis present

## 2020-03-26 DIAGNOSIS — Z20822 Contact with and (suspected) exposure to covid-19: Secondary | ICD-10-CM | POA: Diagnosis present

## 2020-03-26 DIAGNOSIS — C49A0 Gastrointestinal stromal tumor, unspecified site: Secondary | ICD-10-CM | POA: Diagnosis not present

## 2020-03-26 DIAGNOSIS — Z6823 Body mass index (BMI) 23.0-23.9, adult: Secondary | ICD-10-CM | POA: Diagnosis not present

## 2020-03-26 DIAGNOSIS — D509 Iron deficiency anemia, unspecified: Secondary | ICD-10-CM | POA: Diagnosis present

## 2020-03-26 DIAGNOSIS — D62 Acute posthemorrhagic anemia: Secondary | ICD-10-CM | POA: Diagnosis present

## 2020-03-26 DIAGNOSIS — R001 Bradycardia, unspecified: Secondary | ICD-10-CM | POA: Diagnosis not present

## 2020-03-26 HISTORY — PX: BIOPSY: SHX5522

## 2020-03-26 HISTORY — PX: ESOPHAGOGASTRODUODENOSCOPY (EGD) WITH PROPOFOL: SHX5813

## 2020-03-26 LAB — BASIC METABOLIC PANEL
Anion gap: 7 (ref 5–15)
BUN: 12 mg/dL (ref 8–23)
CO2: 25 mmol/L (ref 22–32)
Calcium: 8.2 mg/dL — ABNORMAL LOW (ref 8.9–10.3)
Chloride: 109 mmol/L (ref 98–111)
Creatinine, Ser: 0.93 mg/dL (ref 0.44–1.00)
GFR calc Af Amer: >60 mL/min (ref >60)
GFR calc non Af Amer: 60 mL/min — ABNORMAL LOW (ref >60)
Glucose, Bld: 121 mg/dL — ABNORMAL HIGH (ref 70–99)
Potassium: 3 mmol/L — ABNORMAL LOW (ref 3.5–5.1)
Sodium: 141 mmol/L (ref 135–145)

## 2020-03-26 LAB — CBC
HCT: 20.8 % — ABNORMAL LOW (ref 36.0–46.0)
Hemoglobin: 6.1 g/dL — CL (ref 12.0–15.0)
MCH: 25.2 pg — ABNORMAL LOW (ref 26.0–34.0)
MCHC: 29.3 g/dL — ABNORMAL LOW (ref 30.0–36.0)
MCV: 86 fL (ref 80.0–100.0)
Platelets: 193 10*3/uL (ref 150–400)
RBC: 2.42 MIL/uL — ABNORMAL LOW (ref 3.87–5.11)
RDW: 18.9 % — ABNORMAL HIGH (ref 11.5–15.5)
WBC: 4.1 10*3/uL (ref 4.0–10.5)
nRBC: 0 % (ref 0.0–0.2)

## 2020-03-26 LAB — GLUCOSE, CAPILLARY
Glucose-Capillary: 113 mg/dL — ABNORMAL HIGH (ref 70–99)
Glucose-Capillary: 90 mg/dL (ref 70–99)

## 2020-03-26 LAB — POC OCCULT BLOOD, ED: Fecal Occult Bld: NEGATIVE

## 2020-03-26 LAB — PREPARE RBC (CROSSMATCH)

## 2020-03-26 LAB — HEMOGLOBIN AND HEMATOCRIT, BLOOD
HCT: 36.2 % (ref 36.0–46.0)
Hemoglobin: 11.2 g/dL — ABNORMAL LOW (ref 12.0–15.0)

## 2020-03-26 LAB — SARS CORONAVIRUS 2 BY RT PCR (HOSPITAL ORDER, PERFORMED IN ~~LOC~~ HOSPITAL LAB): SARS Coronavirus 2: NEGATIVE

## 2020-03-26 SURGERY — BIOPSY
Anesthesia: Monitor Anesthesia Care

## 2020-03-26 MED ORDER — PROPOFOL 500 MG/50ML IV EMUL
INTRAVENOUS | Status: DC | PRN
  Administered 2020-03-26: 125 ug/kg/min via INTRAVENOUS

## 2020-03-26 MED ORDER — PROPOFOL 1000 MG/100ML IV EMUL
INTRAVENOUS | Status: AC
  Filled 2020-03-26: qty 100

## 2020-03-26 MED ORDER — ACETAMINOPHEN 325 MG PO TABS
650.0000 mg | ORAL_TABLET | Freq: Once | ORAL | Status: AC
  Administered 2020-03-26: 650 mg via ORAL
  Filled 2020-03-26: qty 2

## 2020-03-26 MED ORDER — SODIUM CHLORIDE (PF) 0.9 % IJ SOLN
INTRAMUSCULAR | Status: AC
  Filled 2020-03-26: qty 50

## 2020-03-26 MED ORDER — PROPOFOL 10 MG/ML IV BOLUS
INTRAVENOUS | Status: DC | PRN
  Administered 2020-03-26: 20 mg via INTRAVENOUS

## 2020-03-26 MED ORDER — ONDANSETRON HCL 4 MG PO TABS: 4.0000 mg | ORAL_TABLET | Freq: Four times a day (QID) | ORAL | Status: DC | PRN

## 2020-03-26 MED ORDER — ONDANSETRON HCL 4 MG/2ML IJ SOLN: 4.0000 mg | Freq: Four times a day (QID) | INTRAMUSCULAR | Status: DC | PRN

## 2020-03-26 MED ORDER — POTASSIUM CHLORIDE CRYS ER 20 MEQ PO TBCR
40.0000 meq | EXTENDED_RELEASE_TABLET | Freq: Once | ORAL | Status: AC
  Administered 2020-03-26: 40 meq via ORAL
  Filled 2020-03-26: qty 2

## 2020-03-26 MED ORDER — SODIUM CHLORIDE 0.9 % IV BOLUS
500.0000 mL | Freq: Once | INTRAVENOUS | Status: AC
  Administered 2020-03-26: 500 mL via INTRAVENOUS

## 2020-03-26 MED ORDER — SODIUM CHLORIDE 0.9 % IV SOLN: INTRAVENOUS | Status: DC

## 2020-03-26 MED ORDER — PANTOPRAZOLE SODIUM 40 MG PO TBEC
40.0000 mg | DELAYED_RELEASE_TABLET | Freq: Two times a day (BID) | ORAL | Status: DC
  Administered 2020-03-26 – 2020-04-01 (×11): 40 mg via ORAL
  Filled 2020-03-26 (×12): qty 1

## 2020-03-26 MED ORDER — SODIUM CHLORIDE 0.9 % IV SOLN
510.0000 mg | Freq: Once | INTRAVENOUS | Status: AC
  Administered 2020-03-26: 510 mg via INTRAVENOUS
  Filled 2020-03-26: qty 510

## 2020-03-26 MED ORDER — SPOT INK MARKER SYRINGE KIT
PACK | SUBMUCOSAL | Status: AC
  Filled 2020-03-26: qty 5

## 2020-03-26 MED ORDER — METOPROLOL TARTRATE 50 MG PO TABS
100.0000 mg | ORAL_TABLET | Freq: Two times a day (BID) | ORAL | Status: DC
  Administered 2020-03-26 – 2020-03-31 (×11): 100 mg via ORAL
  Filled 2020-03-26 (×12): qty 2

## 2020-03-26 MED ORDER — DIPHENHYDRAMINE HCL 50 MG/ML IJ SOLN
25.0000 mg | Freq: Four times a day (QID) | INTRAMUSCULAR | Status: DC | PRN
  Administered 2020-03-26: 25 mg via INTRAVENOUS
  Filled 2020-03-26: qty 1

## 2020-03-26 MED ORDER — FLUTICASONE PROPIONATE 50 MCG/ACT NA SUSP
1.0000 | Freq: Every day | NASAL | Status: DC
  Administered 2020-03-26 – 2020-04-01 (×6): 1 via NASAL
  Filled 2020-03-26: qty 16

## 2020-03-26 MED ORDER — IOHEXOL 9 MG/ML PO SOLN
ORAL | Status: AC
  Filled 2020-03-26: qty 1000

## 2020-03-26 MED ORDER — ASCORBIC ACID 500 MG PO TABS
250.0000 mg | ORAL_TABLET | Freq: Every day | ORAL | Status: DC
  Administered 2020-03-26 – 2020-04-01 (×6): 250 mg via ORAL
  Filled 2020-03-26 (×6): qty 1

## 2020-03-26 MED ORDER — HYDRALAZINE HCL 20 MG/ML IJ SOLN
10.0000 mg | INTRAMUSCULAR | Status: DC | PRN
  Administered 2020-03-26 (×2): 10 mg via INTRAVENOUS
  Filled 2020-03-26 (×2): qty 1

## 2020-03-26 MED ORDER — IOHEXOL 300 MG/ML  SOLN
100.0000 mL | Freq: Once | INTRAMUSCULAR | Status: AC | PRN
  Administered 2020-03-26: 100 mL via INTRAVENOUS

## 2020-03-26 MED ORDER — SODIUM CHLORIDE 0.9% IV SOLUTION: Freq: Once | INTRAVENOUS | Status: AC

## 2020-03-26 MED ORDER — AMLODIPINE BESYLATE 5 MG PO TABS
5.0000 mg | ORAL_TABLET | Freq: Every day | ORAL | Status: DC
  Administered 2020-03-26 – 2020-04-01 (×6): 5 mg via ORAL
  Filled 2020-03-26 (×6): qty 1

## 2020-03-26 MED ORDER — IOHEXOL 9 MG/ML PO SOLN
500.0000 mL | ORAL | Status: AC
  Administered 2020-03-26: 500 mL via ORAL

## 2020-03-26 MED ORDER — LACTATED RINGERS IV SOLN: INTRAVENOUS | Status: DC | PRN

## 2020-03-26 MED ORDER — GLUCAGON HCL RDNA (DIAGNOSTIC) 1 MG IJ SOLR
INTRAMUSCULAR | Status: AC
  Filled 2020-03-26: qty 1

## 2020-03-26 MED ORDER — SODIUM CHLORIDE 0.9 % IV SOLN
INTRAVENOUS | Status: DC | PRN
  Administered 2020-03-26: 250 mL via INTRAVENOUS

## 2020-03-26 MED ORDER — PANTOPRAZOLE SODIUM 40 MG IV SOLR
40.0000 mg | Freq: Two times a day (BID) | INTRAVENOUS | Status: DC
  Administered 2020-03-26: 40 mg via INTRAVENOUS
  Filled 2020-03-26: qty 40

## 2020-03-26 MED ORDER — LIDOCAINE 2% (20 MG/ML) 5 ML SYRINGE
INTRAMUSCULAR | Status: DC | PRN
  Administered 2020-03-26: 50 mg via INTRAVENOUS

## 2020-03-26 NOTE — H&P (Signed)
History and Physical    Lori Rush JOI:[id number] DOB: 1946/03/02 DOA: 03/26/2020  PCP: Glendon Axe, MD  Patient coming from: Home.  Chief Complaint: Low hemoglobin.  HPI: Lori Rush is a 74 y.o. female with history of hypertension was admitted for symptomatic anemia in June 2021 at that time patient had undergone capsule endoscopy which did not show any reason for anemia prior to which patient had a EGD and colonoscopy at an outside facility which at that time showed H. pylori infection patient states she had finished the course of antibiotics for H. pylori has been noticing increasing black stools for the last week had gone to her gastroenterologist had EGD done yesterday which as per the report showed impression #1 was normal esophagus impression #2 a small hiatal hernia impression #3 was abnormal lesion 2 cm appears submucosal with a dimple in the center with no active bleeding and impression #4 fresh blood in the duodenum no source clear.  Per report patient had blood test done and hemoglobin was found to be low and advised to come to the ER.  Over the last 1 week patient also has been feeling weak.  ED Course: In the ER patient is hemodynamically stable hemoglobin is around 7 given the symptomatic anemia patient ordered PRBC transfusion and also since patient had EGD showing some blood in the duodenum admitted for further observation.  Creatinine is 1.4 Covid test.  EKG shows normal sinus rhythm.  Review of Systems: As per HPI, rest all negative.   Past Medical History:  Diagnosis Date  . Hypertension     Past Surgical History:  Procedure Laterality Date  . GIVENS CAPSULE STUDY N/A 01/24/2020   Procedure: GIVENS CAPSULE STUDY;  Surgeon: Yetta Flock, MD;  Location: [facility];  Service: Gastroenterology;  Laterality: N/A;     reports that she has never smoked. She has never used smokeless tobacco. She reports that she does not drink alcohol and does not use  drugs.  No Known Allergies  Family History  Problem Relation Age of Onset  . Diabetes Mother   . Diabetes Father   . Hypertension Father     Prior to Admission medications   Medication Sig Start Date End Date Taking? Authorizing Provider  amLODipine (NORVASC) 5 MG tablet Take 5 mg by mouth daily. 10/08/19   [provider]  atorvastatin (LIPITOR) 20 MG tablet Take 20 mg by mouth at bedtime. 10/11/19   [provider]  bismuth-metronidazole-tetracycline (PYLERA) [phone] MG capsule Take 3 capsules by mouth 4 (four) times daily -  before meals and at bedtime for 10 days. 01/26/20 02/05/20  Mariel Aloe, MD  docusate sodium (COLACE) 100 MG capsule Take 2 capsules (200 mg total) by mouth daily. 10/18/19 10/17/20  Georgette Shell, MD  fluticasone [facility]) 50 MCG/ACT nasal spray Place 1 spray into both nostrils daily as needed for allergies or rhinitis.  12/30/19   [provider]  Iron, Ferrous Sulfate, 325 (65 Fe) MG TABS Take 325 mg by mouth in the morning and at bedtime. 10/18/19   Georgette Shell, MD  losartan (COZAAR) 100 MG tablet Take 100 mg by mouth daily. 08/19/19   [provider]  metoprolol (LOPRESSOR) 100 MG tablet Take 100 mg by mouth 2 (two) times daily.    [provider]  ondansetron (ZOFRAN) 4 MG tablet Take 1 tablet (4 mg total) by mouth every 6 (six) hours as needed for nausea. 10/18/19   Georgette Shell, MD  pantoprazole (PROTONIX) 40 MG tablet Take 1 tablet (40 mg total) by mouth 2 (two) times daily for 14 days, THEN 1 tablet (40 mg total) daily. 01/26/20 03/10/20  Mariel Aloe, MD  vitamin C (ASCORBIC ACID) 250 MG tablet Take 1 tablet (250 mg total) by mouth 2 (two) times daily. 10/18/19   Georgette Shell, MD    Physical Exam: Constitutional: Moderately built and nourished. Vitals:   03/26/20 0106 03/26/20 0200 03/26/20 0230 03/26/20 0300  BP: 104/87 (!) 149/69 (!) 137/53 (!) 166/73  Pulse: 60 (!) 53 70 (!)  59  Resp: 16  18 16   Temp:      TempSrc:      SpO2: 98% 100% 95% 98%   Eyes: Anicteric no pallor. ENMT: No discharge from the ears eyes nose or mouth. Neck: No mass felt.  No neck rigidity. Respiratory: No rhonchi or crepitations. Cardiovascular: S1-S2 heard. Abdomen: Soft nontender bowel sounds present. Musculoskeletal: No edema. Skin: No rash. Neurologic: Alert awake oriented to time place and person.  Moves all extremities. Psychiatric: Appears normal.  Normal affect.   Labs on Admission: I have personally reviewed following labs and imaging studies  CBC: Recent Labs  Lab 03/25/20 1938  WBC 6.1  HGB 7.0*  HCT 23.4*  MCV 85.4  PLT 119   Basic Metabolic Panel: Recent Labs  Lab 03/25/20 1938  NA 139  K 3.1*  CL 106  CO2 25  GLUCOSE 140*  BUN 16  CREATININE 1.45*  CALCIUM 8.7*   GFR: CrCl cannot be calculated (Unknown ideal weight.). Liver Function Tests: Recent Labs  Lab 03/25/20 1938  AST 23  ALT 16  ALKPHOS 59  BILITOT 0.5  PROT 6.9  ALBUMIN 3.5   No results for input(s): LIPASE, AMYLASE in the last 168 hours. No results for input(s): AMMONIA in the last 168 hours. Coagulation Profile: No results for input(s): INR, PROTIME in the last 168 hours. Cardiac Enzymes: No results for input(s): CKTOTAL, CKMB, CKMBINDEX, TROPONINI in the last 168 hours. BNP (last 3 results) No results for input(s): PROBNP in the last 8760 hours. HbA1C: No results for input(s): HGBA1C in the last 72 hours. CBG: No results for input(s): GLUCAP in the last 168 hours. Lipid Profile: No results for input(s): CHOL, HDL, LDLCALC, TRIG, CHOLHDL, LDLDIRECT in the last 72 hours. Thyroid Function Tests: No results for input(s): TSH, T4TOTAL, FREET4, T3FREE, THYROIDAB in the last 72 hours. Anemia Panel: No results for input(s): VITAMINB12, FOLATE, FERRITIN, TIBC, IRON, RETICCTPCT in the last 72 hours. Urine analysis:    Component Value Date/Time   COLORURINE STRAW (A)  01/22/2020 2130   APPEARANCEUR CLEAR 01/22/2020 2130   LABSPEC 1.009 01/22/2020 2130   PHURINE 6.0 01/22/2020 2130   GLUCOSEU NEGATIVE 01/22/2020 2130   HGBUR NEGATIVE 01/22/2020 2130   BILIRUBINUR NEGATIVE 01/22/2020 2130   Jolly NEGATIVE 01/22/2020 2130   PROTEINUR NEGATIVE 01/22/2020 2130   NITRITE NEGATIVE 01/22/2020 2130   LEUKOCYTESUR NEGATIVE 01/22/2020 2130   Sepsis Labs: @LABRCNTIP (procalcitonin:4,lacticidven:4) )No results found for this or any previous visit (from the past 240 hour(s)).   Radiological Exams on Admission: No results found.  EKG: Independently reviewed.  Normal sinus rhythm.  Heart rate is around 58 bpm.  Assessment/Plan Principal Problem:   Symptomatic anemia Active Problems:   Hypertension   Acute GI bleeding   CKD (chronic kidney disease) stage 2, GFR 60-89 ml/min    1. Symptomatic anemia with GI bleed endoscopy results as discussed in the history of present  illness.  Had endoscopy as an outpatient and with the [facility].  2 units of PRBC has been ordered follow CBC after transfusion we will keep patient on Protonix May discuss with GI in the morning. 2. Hypertension we will keep patient as needed IV hydralazine since patient is n.p.o. 3. Acute on chronic kidney disease stage III likely worsened from low hemoglobin which I think will improve with transfusion.  Follow metabolic panel.   DVT prophylaxis: SCDs. Code Status: Full code. Family Communication: Discussed with patient. Disposition Plan: Home. Consults called: None. Admission status: Observation.   [name] MD [facility] Pager [phone].  If 7PM-7AM, please contact night-coverage [email or web address] Password [email or web address]  03/26/2020, 3:50 AM

## 2020-03-26 NOTE — Op Note (Signed)
[facility] Patient Name: Lori Rush Procedure Date: 03/26/2020 MRN: [id number] Attending MD: Estill Cotta. Loletha Carrow , MD Date of Birth: Dec 08, 1945 CSN: [id number] Age: 74 Admit Type: Inpatient Procedure:                Upper GI endoscopy Indications:              Acute post hemorrhagic anemia, Iron deficiency                            anemia secondary to chronic blood loss, Melena                            (inpatient consult note from today details history                            and workup) Providers:                Estill Cotta. Loletha Carrow, MD, Cleda Daub, RN, Tyrone Apple, Technician, Jefm Miles CRNA Referring MD:             [facility] Medicines:                Monitored Anesthesia Care Complications:            No immediate complications. Estimated Blood Loss:     Estimated blood loss was minimal. Procedure:                Pre-Anesthesia Assessment:                           - Prior to the procedure, a History and Physical                            was performed, and patient medications and                            allergies were reviewed. The patient's tolerance of                            previous anesthesia was also reviewed. The risks                            and benefits of the procedure and the sedation                            options and risks were discussed with the patient.                            All questions were answered, and informed consent                            was obtained. Prior Anticoagulants: The patient has  taken no previous anticoagulant or antiplatelet                            agents. ASA Grade Assessment: III - A patient with                            severe systemic disease. After reviewing the risks                            and benefits, the patient was deemed in                            satisfactory condition to undergo the procedure.                            After obtaining informed consent, the endoscope was                            passed under direct vision. Throughout the                            procedure, the patient's blood pressure, pulse, and                            oxygen saturations were monitored continuously. The                            [id number]-H190 ([id number]) Olympus gastroscope was                            introduced through the mouth and advanced to the                            second part of duodenum. After obtaining informed                            consent, the endoscope was passed under direct                            vision. Throughout the procedure, the patient's                            blood pressure, pulse, and oxygen saturations were                            monitored continuously.The small bowel enteroscopy                            was accomplished without difficulty. The patient                            tolerated the procedure well. Scope In: Scope Out: Findings:      The esophagus was normal.      A large (5-6cm), submucosal mass with  no bleeding but with stigmata of       recent bleeding was found on the lesser curvature of the gastric antrum.       It was close to the incisura and encompassing much of the lumen. There       was no GOO effect from the mass. The pylorus was clearly seen and easily       traversed. The mass had a large deep central umbilication, into which       the scope was introduced to discover a large ulcer with adherent clots       as stigmata of bleeding. Biopsies were taken with a cold forceps for       histology from the edge of the umbilication.      The exam of the stomach was otherwise normal.      The examined duodenum was normal. Impression:               - Normal esophagus.                           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 Biopsied. 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 It is very clearly the source of                            bleeding but is not amenable to endoscopic therapy.                           - Normal examined duodenum. Moderate Sedation:      MAC sedation used Recommendation:           - Return patient to hospital ward for ongoing care.                           - Resume regular diet AFTER CT SCAN PERFORMED.                           - Use Protonix (pantoprazole) 40 mg PO BID.                           - Perform a CT scan (computed tomography) of chest                            with contrast, abdomen with contrast and pelvis  with contrast.                           - Surgical consult after CT scan resulted.                           - Await pathology results. These may be                            non-diagnostic given the submucosal nature of this                            lesion. Procedure Code(s):        --- Professional ---                           [phone], Esophagogastroduodenoscopy, flexible,                            transoral; with biopsy, single or multiple Diagnosis Code(s):        --- Professional ---                           D49.0, Neoplasm of unspecified behavior of                            digestive system                           D62, Acute posthemorrhagic anemia                           D50.0, Iron deficiency anemia secondary to blood                            loss (chronic)                           K92.1, Melena (includes Hematochezia) CPT copyright 2019 American Medical Association. All rights reserved. The codes documented in this report are preliminary and upon coder review may  be revised to meet current compliance requirements. Sayf Kerner L. Loletha Carrow, MD 03/26/2020 2:44:55 PM This report has been signed electronically. Number  of Addenda: 0

## 2020-03-26 NOTE — Anesthesia Preprocedure Evaluation (Signed)
Anesthesia Evaluation  Patient identified by MRN, date of birth, ID band Patient awake    Reviewed: Allergy & Precautions, NPO status , Patient's Chart, lab work & pertinent test results, reviewed documented beta blocker date and time   Airway Mallampati: II       Dental no notable dental hx. (+) Teeth Intact   Pulmonary neg pulmonary ROS,    Pulmonary exam normal breath sounds clear to auscultation       Cardiovascular hypertension, Pt. on medications and Pt. on home beta blockers Normal cardiovascular exam Rhythm:Regular Rate:Normal     Neuro/Psych negative neurological ROS     GI/Hepatic Neg liver ROS,   Endo/Other    Renal/GU Renal InsufficiencyRenal disease  negative genitourinary   Musculoskeletal negative musculoskeletal ROS (+)   Abdominal Normal abdominal exam  (+)   Peds  Hematology  (+) Blood dyscrasia, anemia ,   Anesthesia Other Findings   Reproductive/Obstetrics                             Anesthesia Physical Anesthesia Plan  ASA: II  Anesthesia Plan: MAC   Post-op Pain Management:    Induction:   PONV Risk Score and Plan: Propofol infusion  Airway Management Planned: Natural Airway and Mask  Additional Equipment: None  Intra-op Plan:   Post-operative Plan:   Informed Consent: I have reviewed the patients History and Physical, chart, labs and discussed the procedure including the risks, benefits and alternatives for the proposed anesthesia with the patient or authorized representative who has indicated his/her understanding and acceptance.       Plan Discussed with: CRNA  Anesthesia Plan Comments:         Anesthesia Quick Evaluation

## 2020-03-26 NOTE — Interval H&P Note (Signed)
History and Physical Interval Note:  03/26/2020 2:06 PM  Lori Rush  has presented today for surgery, with the diagnosis of Iron deficiency anemia, melena, submucosal gastsric mass.  The various methods of treatment have been discussed with the patient and family. After consideration of risks, benefits and other options for treatment, the patient has consented to  Procedure(s): ENTEROSCOPY (N/A) as a surgical intervention.  The patient's history has been reviewed, patient examined, no change in status, stable for surgery.  I have reviewed the patient's chart and labs.  Questions were answered to the patient's satisfaction.     Nelida Meuse III

## 2020-03-26 NOTE — Transfer of Care (Signed)
Immediate Anesthesia Transfer of Care Note  Patient: Lori Rush  Procedure(s) Performed: BIOPSY ESOPHAGOGASTRODUODENOSCOPY (EGD) WITH PROPOFOL (N/A )  Patient Location: PACU and Endoscopy Unit  Anesthesia Type:MAC  Level of Consciousness: awake, drowsy and patient cooperative  Airway & Oxygen Therapy: Patient Spontanous Breathing and Patient connected to face mask oxygen  Post-op Assessment: Report given to RN and Post -op Vital signs reviewed and stable  Post vital signs: Reviewed and stable  Last Vitals:  Vitals Value Taken Time  BP    Temp    Pulse    Resp    SpO2      Last Pain:  Vitals:   03/26/20 1342  TempSrc: Oral  PainSc: 0-No pain         Complications: No complications documented.

## 2020-03-26 NOTE — H&P (View-Only) (Signed)
03/26/2020 Lori Rush [id number] [birth year]   Referring physician: Dr. Hal Hope Gastroenterologist: Dr. Alan Ripper Primary care physician: Dr. Glendon Axe    CHIEF COMPLAINT: Iron deficiency anemia, melena,  gastric mass   HISTORY OF PRESENT ILLNESS: [name] is a 74 year old female with a past medical history of hypertension, chronic kidney disease stage II, H. Pylori gastritis and iron deficiency anemia.  Past single oophorectomy in her early 11s.    She was admitted to the hospital 10/16/2019 with IDA and melena. She received 2 units of packed red blood cells and she received IV iron.  FOBT was negative at that time.  Posttransfusion hemoglobin was 8.6 and she was discharged home on 10/18/2019 with instructions to undergo GI evaluation.  She underwent an EGD 12/08/2019 by Dr. Alan Ripper  which showed H. pylori gastritis which was not treated until her June 2021 hospital admission.  A colonoscopy was done on 12/22/2019 which showed diverticulosis without evidence of bleeding or polyps.   She was admitted to the hospital 01/22/2020 with recurrence of melena and symptomatic  IDA.  Admission hemoglobin was 6.2.  Received several units of packed red blood cells.  Hg 8.4 -> 9.5. She was seen by our GI team and a small bowel capsule endoscopy was done 01/24/2020 which showed a few gastric erosions otherwise was normal.  She was discharged home on 01/26/2020 with instructions to take Pylera for 10 days and Protonix 40 mg twice daily x2 weeks due to having + H. Pylori (per EGD by Dr. Alan Ripper 12/08/2019)  and to follow-up with Dr. Alan Ripper.  At [facility].  She had recurrence of black loose and solid stools 1 to 2 times daily on 03/17/2020. She contacted Dr. Alan Ripper and she  underwent an EGD 03/25/2020 which showed an abnormal  2cm submucosal lesion in the stomach without active bleeding from this site and fresh blood was in the duodenum without etiology of bleeding site found. Labs done showed WBC 6.6. Hg  7.1. HCT 24.9. MCV 86. PLT 220. BUN 12. Cr. 1.0. An abdominal/pelvic CT without contrast was done which showed here is a question of a mass which may involve the wall of the stomach which appears both extrinsic and intrinsic and measures approximately 5.4 x 5.4 x 6.6 cm. No evidence of left upper quadrant adenopathy or mass otherwise. She was contacted by Dr. Alan Ripper today and she was advised to present to [facility] for further GI evaluation. Labs in the ED showed K+ 3.1. BUN 16. Cr. 1.45. Alk phos 59. AST 23. ALT 16. WBC 6.1. Hg 7.0. HCT 23.4. MCV 85.4. PLT 224. Two units of PRBCs were ordered, the 2nd units is transfusing at this time.   Currently, she denies having any nausea or vomiting.  No upper or lower abdominal pain.  No history of GERD.  No heartburn or dysphagia. She was taking Pantoprazole 40 mg twice daily and following her EGD yesterday she was advised by Dr. Alan Ripper to increase Pantoprazole to twice daily.  She previously took Ferrous Sulfate but she stopped taking it 1 or 2 weeks ago.  Her black stools continued after she stopped taking the ferrous sulfate.  No NSAID use.  She saw a small amount of bright red blood on the toilet tissue on 1 occasion over the past 4 weeks without recurrence.  She underwent a colonoscopy 11/2019 which showed diverticulosis, no polyps.  Appetite is decreased.  She reports losing 10 pounds over the past 4 weeks.  No fever, sweats or  chills.  No alcohol use.  She is a non-smoker.  She lives with her sister.  No family history of esophageal, gastric or colon cancer.   EGD 03/25/2020 by Dr. Twana First Shahid: Normal esophagus Small hiatal hernia Stomach: Abnormal lesion 2cm appears submucosal with dimple in center. No active bleeding from this area. Biopsies taken. Fresh blood in the duodenum. Blood was irrigated but no etiology was found. No AVM, ulcer or Deiualfoy lesion   CTAP without IV or oral contrast 03/25/2020 at [facility]: There is a question of a  mass which may involve the wall of the stomach which appears both extrinsic and intrinsic and measures approximately 5.4 x 5.4 x 6.6 cm. No evidence of left upper quadrant adenopathy or mass otherwise. No retroperitoneal mass, adenopathy or aneurysm No gallstones No renal or ureteral calculi  Note: the mass was present on prior ultrasound dated 01/21/2018 and prior CT 02/04/2018  Small bowel Givens capsule 01/24/2020: A few gastric erosions otherwise all normal small bowel  EGD 12/08/2019 by Dr. Alan Ripper: Gastritis. Biopsies + for H. Pylori. Treated with Pylera 01/28/2020.  Hiatal hernia Normal duodenum, no evidence of GI bleeding  Colonoscopy 12/22/2019: Diverticulosis to the ascending colon   Past Medical History:  Diagnosis Date  . Hypertension    Past Surgical History:  Procedure Laterality Date  . GIVENS CAPSULE STUDY N/A 01/24/2020   Procedure: GIVENS CAPSULE STUDY;  Surgeon: Yetta Flock, MD;  Location: [facility];  Service: Gastroenterology;  Laterality: N/A;    Social History: She is single.  She lives with her sister.  She is a non-smoker.  No alcohol or drug use.  Family history: Mother died at age of 76 with history of hypertension and diabetes.  Father died at the age of 56 with history of hypertension diabetes.  No Known Allergies   No current facility-administered medications on file prior to encounter.   Current Outpatient Medications on File Prior to Encounter  Medication Sig Dispense Refill  . amLODipine (NORVASC) 5 MG tablet Take 5 mg by mouth daily.    Marland Kitchen atorvastatin (LIPITOR) 20 MG tablet Take 20 mg by mouth at bedtime.    . docusate sodium (COLACE) 100 MG capsule Take 2 capsules (200 mg total) by mouth daily. 30 capsule 2  . fluticasone (FLONASE) 50 MCG/ACT nasal spray Place 1 spray into both nostrils daily as needed for allergies or rhinitis.     . Iron, Ferrous Sulfate, 325 (65 Fe) MG TABS Take 325 mg by mouth in the morning and at bedtime. (Patient  taking differently: Take 325 mg by mouth at bedtime. ) 30 tablet   . losartan (COZAAR) 100 MG tablet Take 100 mg by mouth daily.    . metoprolol (LOPRESSOR) 100 MG tablet Take 100 mg by mouth 2 (two) times daily.    . ondansetron (ZOFRAN) 4 MG tablet Take 1 tablet (4 mg total) by mouth every 6 (six) hours as needed for nausea. 20 tablet 0  . pantoprazole (PROTONIX) 40 MG tablet Take 1 tablet (40 mg total) by mouth 2 (two) times daily for 14 days, THEN 1 tablet (40 mg total) daily. (Patient taking differently: Take one tablet (78m) once daily.) 58 tablet 0  . vitamin C (ASCORBIC ACID) 250 MG tablet Take 1 tablet (250 mg total) by mouth 2 (two) times daily. (Patient taking differently: Take 250 mg by mouth daily. )      REVIEW OF SYSTEMS:  Gen:  + 10 pound weight loss in the  past month.  Denies fever, sweats or chills.  CV: Denies chest pain, palpitations or edema. Resp: Denies cough, shortness of breath of hemoptysis.  GI: See HPI. GU : Denies urinary burning, blood in urine, increased urinary frequency or incontinence. MS: Denies joint pain, muscles aches or weakness. Derm: Denies rash, itchiness, skin lesions or unhealing ulcers. Psych: Denies depression, anxiety, loss. Heme: Denies bruising, bleeding. Neuro:  Denies headaches, dizziness or paresthesias. Endo:  Denies any problems with DM, thyroid or adrenal function.   PHYSICAL EXAM: BP (!) 157/70 (BP Location: Right Arm)   Pulse 65   Temp 98.2 F (36.8 C) (Oral)   Resp 16   Ht 5' 2" (1.575 m)   Wt 58.7 kg   SpO2 100%   BMI 23.67 kg/m  General: Well developed 74 year old female in no acute distress. Head: Normocephalic and atraumatic. Eyes:  Sclerae non-icteric, conjunctive pink. Ears: Normal auditory acuity. Mouth: Dentition intact. No ulcers or lesions.  Neck: Supple, no lymphadenopathy or thyromegaly.  Lungs: Clear bilaterally to auscultation without wheezes, crackles or rhonchi. Heart: Regular rate and rhythm. No  murmur, rub or gallop appreciated.  Abdomen: Soft, nontender, non distended. No masses. No hepatosplenomegaly. Normoactive bowel sounds x 4 quadrants.  Rectal: No external hemorrhoids.  Small smear of black stool grossly heme positive.  No mass. Musculoskeletal: Symmetrical with no gross deformities. Skin: Warm and dry. No rash or lesions on visible extremities. Extremities: No edema. Neurological: Alert oriented x 4, no focal deficits.  Psychological:  Alert and cooperative. Normal mood and affect.  ASSESSMENT AND PLAN:  59.  74 year old female with recurrent iron deficiency anemia and melena. S/P EGD 8/26 by Dr. Alan Ripper showed a 2 cm submucosal lesion in the stomach with fresh blood in the duodenum.  Admission hemoglobin 6.1.  Two units of packed red blood cells ordered, the second unit is transfusing at this time. -NPO -IVF per the hospitalist  -Repeat H/H post transfusion -Transfuse for hemoglobin less than 7 -Pantoprazole 40 mg IV twice daily -Small bowel enteroscopy with Dr. Loletha Carrow today -Further recommendations to be determined after enteroscopy completed  2.  Chronic kidney disease stage II. Cr 1.45 -> 0.93     Carl Best, NP   I have reviewed the entire case in detail with the above APP and discussed the plan in detail.  Therefore, I agree with the diagnoses recorded above. In addition,  I have personally interviewed and examined the patient.  My additional thoughts are as follows:  Recurrent acute on chronic melena and anemia of GI blood loss. This patient had an upper endoscopy yesterday at an outside clinic with report of fresh blood in the duodenum but no underlying lesion identified.  There is also a vague description of what might be a submucosal gastric lesion.  Location is not noted in the endoscopy report.  The mass is also vaguely described on a noncontrast CT scan done at the outside clinic, images not available but the report does not state where in the  stomach this lesion is.  Video capsule study done as an inpatient on our hospital system 2 months ago did not see a source of bleeding, however I am concerned that the source may just be in the proximal small bowel and bleeding intermittently, so not detected on capsule study. I am concerned about the information missing from this patient's reports of outside testing, and therefore feel this is not completely reliable.  This patient is currently hemodynamically stable and has received 2 units  of PRBCs for her a.m. hemoglobin of 6.0, which had dropped down from 7 upon admission last evening.  I believe she has still been bleeding, thus we have planned an upper endoscopy and possible small bowel enteroscopy today.  She was agreeable after a thorough discussion of procedure and risks.  The benefits and risks of the planned procedure were described in detail with the patient or (when appropriate) their health care proxy.  Risks were outlined as including, but not limited to, bleeding, infection, perforation, adverse medication reaction leading to cardiac or pulmonary decompensation, pancreatitis (if ERCP).  The limitation of incomplete mucosal visualization was also discussed.  No guarantees or warranties were given.  Patient at increased risk for cardiopulmonary complications of procedure due to medical comorbidities.  Further work-up to characterize the reported gastric lesion will be attended to after work-up and treatment of GI bleeding.   Nelida Meuse III [phone]

## 2020-03-26 NOTE — Progress Notes (Signed)
Patient c/o itchy eyes and throat. Both eyes puffy and red. Denies any difficulty breathing or swallowing. Transporter present to take patient to CT scan. Patient reports she can wait until after the scan to take diphenhydramine. Will contact the MD to alert her of allergy and obtain order.

## 2020-03-26 NOTE — ED Provider Notes (Signed)
[facility] DEPT Provider Note   CSN: [id number] Arrival date & time: 03/25/20  1853     History Chief Complaint  Patient presents with  . abnormal labs    Lori Rush is a 74 y.o. female history of hypertension, CKD, H pylori. Patient presents today sent by PCP for blood transfusion.  Patient reports she was seen by her primary care doctor's office yesterday after having an endoscopy, she was called today to inform her that hemoglobin was low and to come to the ER for evaluation and blood transfusion.  Patient reports that she has had melena for several weeks, no associated pain, she reports that over the past several days she has developed increasing fatigue and generalized weakness, reports otherwise feeling well.  Denies fever/chills, chest pain/shortness of breath, cough/hemoptysis, abdominal pain, vomiting, diarrhea, dysuria/hematuria or any additional concerns.  HPI     Past Medical History:  Diagnosis Date  . Hypertension     Patient Active Problem List   Diagnosis Date Noted  . H. pylori infection 01/26/2020  . Heme + stool   . Dark stools   . Acute GI bleeding 01/22/2020  . CKD (chronic kidney disease) stage 2, GFR 60-89 ml/min 01/22/2020  . Acute blood loss anemia 01/22/2020  . Mild renal insufficiency 10/17/2019  . Symptomatic anemia 10/16/2019  . Hypertension     Past Surgical History:  Procedure Laterality Date  . GIVENS CAPSULE STUDY N/A 01/24/2020   Procedure: GIVENS CAPSULE STUDY;  Surgeon: Yetta Flock, MD;  Location: [facility];  Service: Gastroenterology;  Laterality: N/A;     OB History   No obstetric history on file.     Family History  Problem Relation Age of Onset  . Diabetes Mother   . Diabetes Father   . Hypertension Father     Social History   Tobacco Use  . Smoking status: Never Smoker  . Smokeless tobacco: Never Used  Substance Use Topics  . Alcohol use: No  . Drug use: No    Home  Medications Prior to Admission medications   Medication Sig Start Date End Date Taking? Authorizing Provider  amLODipine (NORVASC) 5 MG tablet Take 5 mg by mouth daily. 10/08/19   [provider]  atorvastatin (LIPITOR) 20 MG tablet Take 20 mg by mouth at bedtime. 10/11/19   [provider]  bismuth-metronidazole-tetracycline (PYLERA) [phone] MG capsule Take 3 capsules by mouth 4 (four) times daily -  before meals and at bedtime for 10 days. 01/26/20 02/05/20  Mariel Aloe, MD  docusate sodium (COLACE) 100 MG capsule Take 2 capsules (200 mg total) by mouth daily. 10/18/19 10/17/20  Georgette Shell, MD  fluticasone [facility]) 50 MCG/ACT nasal spray Place 1 spray into both nostrils daily as needed for allergies or rhinitis.  12/30/19   [provider]  Iron, Ferrous Sulfate, 325 (65 Fe) MG TABS Take 325 mg by mouth in the morning and at bedtime. 10/18/19   Georgette Shell, MD  losartan (COZAAR) 100 MG tablet Take 100 mg by mouth daily. 08/19/19   [provider]  metoprolol (LOPRESSOR) 100 MG tablet Take 100 mg by mouth 2 (two) times daily.    [provider]  ondansetron (ZOFRAN) 4 MG tablet Take 1 tablet (4 mg total) by mouth every 6 (six) hours as needed for nausea. 10/18/19   Georgette Shell, MD  pantoprazole (PROTONIX) 40 MG tablet Take 1 tablet (40 mg total) by mouth 2 (two) times daily  for 14 days, THEN 1 tablet (40 mg total) daily. 01/26/20 03/10/20  Mariel Aloe, MD  vitamin C (ASCORBIC ACID) 250 MG tablet Take 1 tablet (250 mg total) by mouth 2 (two) times daily. 10/18/19   Georgette Shell, MD    Allergies    Patient has no known allergies.  Review of Systems   Review of Systems Ten systems are reviewed and are negative for acute change except as noted in the HPI  Physical Exam Updated Vital Signs BP (!) 149/69   Pulse (!) 53   Temp 99.1 F (37.3 C) (Oral)   Resp 16   SpO2 100%   Physical Exam Constitutional:       General: She is not in acute distress.    Appearance: Normal appearance. She is well-developed. She is not ill-appearing or diaphoretic.  HENT:     Head: Normocephalic and atraumatic.  Eyes:     General: Vision grossly intact. Gaze aligned appropriately.     Pupils: Pupils are equal, round, and reactive to light.  Neck:     Trachea: Trachea and phonation normal.  Pulmonary:     Effort: Pulmonary effort is normal. No respiratory distress.  Abdominal:     General: There is no distension.     Palpations: Abdomen is soft.     Tenderness: There is no abdominal tenderness. There is no guarding or rebound.  Genitourinary:    Comments: Rectal examination chaperoned by Lucina Mellow.  No external hemorrhoids lesions or abnormalities.  Normal rectal tone.  No palpable internal abnormalities, no gross blood on exam.  No stool in vault. Musculoskeletal:        General: Normal range of motion.     Cervical back: Normal range of motion.  Skin:    General: Skin is warm and dry.  Neurological:     Mental Status: She is alert.     GCS: GCS eye subscore is 4. GCS verbal subscore is 5. GCS motor subscore is 6.     Comments: Speech is clear and goal oriented, follows commands Major Cranial nerves without deficit, no facial droop Moves extremities without ataxia, coordination intact  Psychiatric:        Behavior: Behavior normal.     ED Results / Procedures / Treatments   Labs (all labs ordered are listed, but only abnormal results are displayed) Labs Reviewed  COMPREHENSIVE METABOLIC PANEL - Abnormal; Notable for the following components:      Result Value   Potassium 3.1 (*)    Glucose, Bld 140 (*)    Creatinine, Ser 1.45 (*)    Calcium 8.7 (*)    GFR calc non Af Amer 35 (*)    GFR calc Af Amer 40 (*)    All other components within normal limits  CBC - Abnormal; Notable for the following components:   RBC 2.74 (*)    Hemoglobin 7.0 (*)    HCT 23.4 (*)    MCH 25.5 (*)    MCHC 29.9 (*)     RDW 19.2 (*)    nRBC 0.3 (*)    All other components within normal limits  SARS CORONAVIRUS 2 BY RT PCR (HOSPITAL ORDER, [facility])  POC OCCULT BLOOD, ED  TYPE AND SCREEN  PREPARE RBC (CROSSMATCH)    EKG None  Radiology No results found.  Procedures .Critical Care Performed by: Deliah Boston, PA-C Authorized by: Deliah Boston, PA-C   Critical care provider statement:  Critical care time (minutes):  35   Critical care was necessary to treat or prevent imminent or life-threatening deterioration of the following conditions: Symptomatic anemia.   Critical care was time spent personally by me on the following activities:  Discussions with consultants, evaluation of patient's response to treatment, examination of patient, ordering and performing treatments and interventions, ordering and review of laboratory studies, ordering and review of radiographic studies, pulse oximetry, re-evaluation of patient's condition, obtaining history from patient or surrogate, review of old charts and development of treatment plan with patient or surrogate   (including critical care time)  Medications Ordered in ED Medications  0.9 %  sodium chloride infusion (Manually program via Guardrails IV Fluids) (has no administration in time range)  sodium chloride 0.9 % bolus 500 mL (0 mLs Intravenous Stopped 03/26/20 0228)    ED Course  I have reviewed the triage vital signs and the nursing notes.  Pertinent labs & imaging results that were available during my care of the patient were reviewed by me and considered in my medical decision making (see chart for details).    MDM Rules/Calculators/A&P                          Additional history obtained from: 1. Nursing notes from this visit. 2. Paperwork provided by patient showing EGD yesterday.  Impression #1, normal esophagus.  Impression #2 small hiatal hernia.  Impression #3 abnormal lesion 2 cm-appears submucosal  with dimple in the center.  No active bleeding from this area.  Biopsies taken.  Impression #4 fresh blood in the duodenum.  Blood was irrigated but no etiology was found-no AVM, ulcer, Deiualfoy lesion. ------------------ I ordered, reviewed and interpreted labs which include: Hemoccult negative, of note no stool sample. Type and screen B positive CMP shows hypokalemia 3.1, creatinine minimally elevated at 1.45, no emergent electrolyte derangement, LFT elevation or gap. CBC shows anemia of 7.0, no leukocytosis. Covid test pending  Patient will need admission for symptomatic anemia, 2 units packed red blood cells ordered.  Risks versus benefits gust with patient and she is agreeable to blood transfusion reports she has had 3 in the past.  On reevaluation patient resting comfortably no acute distress vital signs stable on room air only symptom is fatigue.  Discussed case with Dr. Hal Hope at 1:54 AM, patient accepted to hospitalist service.  Note: Portions of this report may have been transcribed using voice recognition software. Every effort was made to ensure accuracy; however, inadvertent computerized transcription errors may still be present. Final Clinical Impression(s) / ED Diagnoses Final diagnoses:  Symptomatic anemia    Rx / DC Orders ED Discharge Orders    None       Gari Crown 03/26/20 0232    Ripley Fraise, MD 03/26/20 [phone]

## 2020-03-26 NOTE — Consult Note (Addendum)
03/26/2020 Carmel Garfield [id number] [birth year]   Referring physician: Dr. Hal Hope Gastroenterologist: Dr. Alan Ripper Primary care physician: Dr. Glendon Axe    CHIEF COMPLAINT: Iron deficiency anemia, melena,  gastric mass   HISTORY OF PRESENT ILLNESS: [name] is a 74 year old female with a past medical history of hypertension, chronic kidney disease stage II, H. Pylori gastritis and iron deficiency anemia.  Past single oophorectomy in her early 11s.    She was admitted to the hospital 10/16/2019 with IDA and melena. She received 2 units of packed red blood cells and she received IV iron.  FOBT was negative at that time.  Posttransfusion hemoglobin was 8.6 and she was discharged home on 10/18/2019 with instructions to undergo GI evaluation.  She underwent an EGD 12/08/2019 by Dr. Alan Ripper  which showed H. pylori gastritis which was not treated until her June 2021 hospital admission.  A colonoscopy was done on 12/22/2019 which showed diverticulosis without evidence of bleeding or polyps.   She was admitted to the hospital 01/22/2020 with recurrence of melena and symptomatic  IDA.  Admission hemoglobin was 6.2.  Received several units of packed red blood cells.  Hg 8.4 -> 9.5. She was seen by our GI team and a small bowel capsule endoscopy was done 01/24/2020 which showed a few gastric erosions otherwise was normal.  She was discharged home on 01/26/2020 with instructions to take Pylera for 10 days and Protonix 40 mg twice daily x2 weeks due to having + H. Pylori (per EGD by Dr. Alan Ripper 12/08/2019)  and to follow-up with Dr. Alan Ripper.  At [facility].  She had recurrence of black loose and solid stools 1 to 2 times daily on 03/17/2020. She contacted Dr. Alan Ripper and she  underwent an EGD 03/25/2020 which showed an abnormal  2cm submucosal lesion in the stomach without active bleeding from this site and fresh blood was in the duodenum without etiology of bleeding site found. Labs done showed WBC 6.6. Hg  7.1. HCT 24.9. MCV 86. PLT 220. BUN 12. Cr. 1.0. An abdominal/pelvic CT without contrast was done which showed here is a question of a mass which may involve the wall of the stomach which appears both extrinsic and intrinsic and measures approximately 5.4 x 5.4 x 6.6 cm. No evidence of left upper quadrant adenopathy or mass otherwise. She was contacted by Dr. Alan Ripper today and she was advised to present to [facility] for further GI evaluation. Labs in the ED showed K+ 3.1. BUN 16. Cr. 1.45. Alk phos 59. AST 23. ALT 16. WBC 6.1. Hg 7.0. HCT 23.4. MCV 85.4. PLT 224. Two units of PRBCs were ordered, the 2nd units is transfusing at this time.   Currently, she denies having any nausea or vomiting.  No upper or lower abdominal pain.  No history of GERD.  No heartburn or dysphagia. She was taking Pantoprazole 40 mg twice daily and following her EGD yesterday she was advised by Dr. Alan Ripper to increase Pantoprazole to twice daily.  She previously took Ferrous Sulfate but she stopped taking it 1 or 2 weeks ago.  Her black stools continued after she stopped taking the ferrous sulfate.  No NSAID use.  She saw a small amount of bright red blood on the toilet tissue on 1 occasion over the past 4 weeks without recurrence.  She underwent a colonoscopy 11/2019 which showed diverticulosis, no polyps.  Appetite is decreased.  She reports losing 10 pounds over the past 4 weeks.  No fever, sweats or  chills.  No alcohol use.  She is a non-smoker.  She lives with her sister.  No family history of esophageal, gastric or colon cancer.   EGD 03/25/2020 by Dr. Twana First Shahid: Normal esophagus Small hiatal hernia Stomach: Abnormal lesion 2cm appears submucosal with dimple in center. No active bleeding from this area. Biopsies taken. Fresh blood in the duodenum. Blood was irrigated but no etiology was found. No AVM, ulcer or Deiualfoy lesion   CTAP without IV or oral contrast 03/25/2020 at [facility]: There is a question of a  mass which may involve the wall of the stomach which appears both extrinsic and intrinsic and measures approximately 5.4 x 5.4 x 6.6 cm. No evidence of left upper quadrant adenopathy or mass otherwise. No retroperitoneal mass, adenopathy or aneurysm No gallstones No renal or ureteral calculi  Note: the mass was present on prior ultrasound dated 01/21/2018 and prior CT 02/04/2018  Small bowel Givens capsule 01/24/2020: A few gastric erosions otherwise all normal small bowel  EGD 12/08/2019 by Dr. Alan Ripper: Gastritis. Biopsies + for H. Pylori. Treated with Pylera 01/28/2020.  Hiatal hernia Normal duodenum, no evidence of GI bleeding  Colonoscopy 12/22/2019: Diverticulosis to the ascending colon   Past Medical History:  Diagnosis Date  . Hypertension    Past Surgical History:  Procedure Laterality Date  . GIVENS CAPSULE STUDY N/A 01/24/2020   Procedure: GIVENS CAPSULE STUDY;  Surgeon: Yetta Flock, MD;  Location: [facility];  Service: Gastroenterology;  Laterality: N/A;    Social History: She is single.  She lives with her sister.  She is a non-smoker.  No alcohol or drug use.  Family history: Mother died at age of 76 with history of hypertension and diabetes.  Father died at the age of 70 with history of hypertension diabetes.  No Known Allergies   No current facility-administered medications on file prior to encounter.   Current Outpatient Medications on File Prior to Encounter  Medication Sig Dispense Refill  . amLODipine (NORVASC) 5 MG tablet Take 5 mg by mouth daily.    Marland Kitchen atorvastatin (LIPITOR) 20 MG tablet Take 20 mg by mouth at bedtime.    . docusate sodium (COLACE) 100 MG capsule Take 2 capsules (200 mg total) by mouth daily. 30 capsule 2  . fluticasone (FLONASE) 50 MCG/ACT nasal spray Place 1 spray into both nostrils daily as needed for allergies or rhinitis.     . Iron, Ferrous Sulfate, 325 (65 Fe) MG TABS Take 325 mg by mouth in the morning and at bedtime. (Patient  taking differently: Take 325 mg by mouth at bedtime. ) 30 tablet   . losartan (COZAAR) 100 MG tablet Take 100 mg by mouth daily.    . metoprolol (LOPRESSOR) 100 MG tablet Take 100 mg by mouth 2 (two) times daily.    . ondansetron (ZOFRAN) 4 MG tablet Take 1 tablet (4 mg total) by mouth every 6 (six) hours as needed for nausea. 20 tablet 0  . pantoprazole (PROTONIX) 40 MG tablet Take 1 tablet (40 mg total) by mouth 2 (two) times daily for 14 days, THEN 1 tablet (40 mg total) daily. (Patient taking differently: Take one tablet (69m) once daily.) 58 tablet 0  . vitamin C (ASCORBIC ACID) 250 MG tablet Take 1 tablet (250 mg total) by mouth 2 (two) times daily. (Patient taking differently: Take 250 mg by mouth daily. )      REVIEW OF SYSTEMS:  Gen:  + 10 pound weight loss in the  past month.  Denies fever, sweats or chills.  CV: Denies chest pain, palpitations or edema. Resp: Denies cough, shortness of breath of hemoptysis.  GI: See HPI. GU : Denies urinary burning, blood in urine, increased urinary frequency or incontinence. MS: Denies joint pain, muscles aches or weakness. Derm: Denies rash, itchiness, skin lesions or unhealing ulcers. Psych: Denies depression, anxiety, loss. Heme: Denies bruising, bleeding. Neuro:  Denies headaches, dizziness or paresthesias. Endo:  Denies any problems with DM, thyroid or adrenal function.   PHYSICAL EXAM: BP (!) 157/70 (BP Location: Right Arm)   Pulse 65   Temp 98.2 F (36.8 C) (Oral)   Resp 16   Ht 5' 2" (1.575 m)   Wt 58.7 kg   SpO2 100%   BMI 23.67 kg/m  General: Well developed 74 year old female in no acute distress. Head: Normocephalic and atraumatic. Eyes:  Sclerae non-icteric, conjunctive pink. Ears: Normal auditory acuity. Mouth: Dentition intact. No ulcers or lesions.  Neck: Supple, no lymphadenopathy or thyromegaly.  Lungs: Clear bilaterally to auscultation without wheezes, crackles or rhonchi. Heart: Regular rate and rhythm. No  murmur, rub or gallop appreciated.  Abdomen: Soft, nontender, non distended. No masses. No hepatosplenomegaly. Normoactive bowel sounds x 4 quadrants.  Rectal: No external hemorrhoids.  Small smear of black stool grossly heme positive.  No mass. Musculoskeletal: Symmetrical with no gross deformities. Skin: Warm and dry. No rash or lesions on visible extremities. Extremities: No edema. Neurological: Alert oriented x 4, no focal deficits.  Psychological:  Alert and cooperative. Normal mood and affect.  ASSESSMENT AND PLAN:  33.  74 year old female with recurrent iron deficiency anemia and melena. S/P EGD 8/26 by Dr. Alan Ripper showed a 2 cm submucosal lesion in the stomach with fresh blood in the duodenum.  Admission hemoglobin 6.1.  Two units of packed red blood cells ordered, the second unit is transfusing at this time. -NPO -IVF per the hospitalist  -Repeat H/H post transfusion -Transfuse for hemoglobin less than 7 -Pantoprazole 40 mg IV twice daily -Small bowel enteroscopy with Dr. Loletha Carrow today -Further recommendations to be determined after enteroscopy completed  2.  Chronic kidney disease stage II. Cr 1.45 -> 0.93     Carl Best, NP   I have reviewed the entire case in detail with the above APP and discussed the plan in detail.  Therefore, I agree with the diagnoses recorded above. In addition,  I have personally interviewed and examined the patient.  My additional thoughts are as follows:  Recurrent acute on chronic melena and anemia of GI blood loss. This patient had an upper endoscopy yesterday at an outside clinic with report of fresh blood in the duodenum but no underlying lesion identified.  There is also a vague description of what might be a submucosal gastric lesion.  Location is not noted in the endoscopy report.  The mass is also vaguely described on a noncontrast CT scan done at the outside clinic, images not available but the report does not state where in the  stomach this lesion is.  Video capsule study done as an inpatient on our hospital system 2 months ago did not see a source of bleeding, however I am concerned that the source may just be in the proximal small bowel and bleeding intermittently, so not detected on capsule study. I am concerned about the information missing from this patient's reports of outside testing, and therefore feel this is not completely reliable.  This patient is currently hemodynamically stable and has received 2 units  of PRBCs for her a.m. hemoglobin of 6.0, which had dropped down from 7 upon admission last evening.  I believe she has still been bleeding, thus we have planned an upper endoscopy and possible small bowel enteroscopy today.  She was agreeable after a thorough discussion of procedure and risks.  The benefits and risks of the planned procedure were described in detail with the patient or (when appropriate) their health care proxy.  Risks were outlined as including, but not limited to, bleeding, infection, perforation, adverse medication reaction leading to cardiac or pulmonary decompensation, pancreatitis (if ERCP).  The limitation of incomplete mucosal visualization was also discussed.  No guarantees or warranties were given.  Patient at increased risk for cardiopulmonary complications of procedure due to medical comorbidities.  Further work-up to characterize the reported gastric lesion will be attended to after work-up and treatment of GI bleeding.   Nelida Meuse III Office:[phone]

## 2020-03-26 NOTE — Anesthesia Procedure Notes (Signed)
Procedure Name: MAC Date/Time: 03/26/2020 2:14 PM Performed by: Eben Burow, CRNA Pre-anesthesia Checklist: Patient identified, Emergency Drugs available, Suction available, Patient being monitored and Timeout performed Oxygen Delivery Method: Simple face mask Placement Confirmation: positive ETCO2

## 2020-03-26 NOTE — Progress Notes (Signed)
[facility]                                                                              Patient Demographics  Lori Rush, is a 74 y.o. female, DOB - Jan 22, 1946, KXF:[id number]  Admit date - 03/26/2020   Admitting Physician [name], MD  Outpatient Primary MD for the patient is Glendon Axe, MD  Outpatient specialists:   LOS - 0  days   Medical records reviewed and are as summarized below:    Chief Complaint  Patient presents with  . abnormal labs       Brief summary   Patient is a 74 year old female with history of hypertension, CKD stage II, iron deficiency anemia, was admitted for symptomatic anemia in 12/2019.  At that time patient had undergone capsule endoscopy which did not show any reason for anemia.  Patient had endoscopy on 8/26 which showed normal esophagus, small hiatal hernia, abnormal lesion 2 cm, submucosal, no active bleeding, fresh blood in the duodenum, was irrigated but no etiology was found.  Patient was seen at PCPs office on 8/26 and was informed that her hemoglobin was very low and recommended ER evaluation and blood transfusion.  Patient also reported she had melanotic stools for last several weeks otherwise no abdominal pain.  She also reported increasing fatigue, generalized weakness, no chest pain or shortness of breath.   Assessment & Plan    Principal Problem:   Symptomatic anemia in the setting of upper GI bleed, chronic blood loss anemia, iron deficiency anemia -EGD outpatient showed submucosal abnormal lesion 2 cm, fresh blood in duodenum. 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  Patient also had a CT abdomen which showed 5-6 cm submucosal lesion in the stomach, faxing the report to the floor.  Dr. Alan Ripper requested [facility] consult for endoscopy ultrasound  and further work-up. -Anemia profile, previous iron studies in 12/2019 showed ferritin of 24, iron 48, % sat ratio 13 -Consulted gastroenterology, will await recommendations  Active Problems:   Hypertension -BP elevated, resume Norvasc, metoprolol  Acute on CKD (chronic kidney disease) stage 2, GFR 60-89 ml/min -Creatinine 1.4 at the time of admission likely due to severe anemia -Patient received IV fluid hydration and currently receiving packed RBC transfusion, creatinine improved to 0.9  Hypokalemia -Replaced p.o.  Code Status: Full CODE STATUS DVT Prophylaxis:   SCD's Family Communication: Discussed all imaging results, lab results, explained to the patient. Called pt's sister but number appears to be wrong      Disposition Plan:     Status is: Observation  The patient will require care spanning > 2 midnights and should be moved to inpatient because: Inpatient level of care appropriate due to severity of illness  Dispo: The patient is from: Home              Anticipated d/c is to: Home              Anticipated d/c date is: 1 day  Patient currently is not medically stable to d/c.  Hemoglobin severely low 6.1, transfusing 2 units, needs further GI work-up      Time Spent in minutes 35 minutes review of the chart, discussion with patient's outpatient gastroenterologist, inpatient consultation with gastroenterology  Procedures:  None   Consultants:   GI - Dr Loletha Carrow   Antimicrobials:   Anti-infectives (From admission, onward)   None          Medications  Scheduled Meds: . metoprolol tartrate  100 mg Oral BID  . pantoprazole (PROTONIX) IV  40 mg Intravenous Q12H   Continuous Infusions: PRN Meds:.hydrALAZINE, ondansetron **OR** ondansetron (ZOFRAN) IV      Subjective:   Lori Rush was seen and examined today.  Currently no active bleeding, no nausea vomiting or diarrhea.  No abdominal pain.  At the time of my evaluation, started on first unit  of packed RBC transfusion.  Patient denies dizziness, chest pain, shortness of breath, new weakness, numbess, tingling. No acute events overnight.    Objective:   Vitals:   03/26/20 0753 03/26/20 0817 03/26/20 0928 03/26/20 1119  BP: (!) 151/69 (!) 160/78 (!) 167/71 (!) 141/80  Pulse: (!) 57 (!) 58 (!) 53 71  Resp: 14 16  14   Temp: 98.5 F (36.9 C) 98.5 F (36.9 C)  98.3 F (36.8 C)  TempSrc: Oral Oral  Oral  SpO2: 100% 100%  100%  Weight:      Height:        Intake/Output Summary (Last 24 hours) at 03/26/2020 1122 Last data filed at 03/26/2020 9798 Gross per 24 hour  Intake 0 ml  Output --  Net 0 ml     Wt Readings from Last 3 Encounters:  03/26/20 58.7 kg  01/23/20 60.7 kg  10/16/19 62.3 kg     Exam  General: Alert and oriented x 3, NAD  Cardiovascular: S1 S2 auscultated, no murmurs, RRR  Respiratory: Clear to auscultation bilaterally, no wheezing, rales or rhonchi  Gastrointestinal: Soft, nontender, nondistended, + bowel sounds  Ext: no pedal edema bilaterally  Neuro: no new deficits  Musculoskeletal: No digital cyanosis, clubbing  Skin: No rashes  Psych: Normal affect and demeanor, alert and oriented x3    Data Reviewed:  I have personally reviewed following labs and imaging studies  Micro Results Recent Results (from the past 240 hour(s))  SARS Coronavirus 2 by RT PCR (hospital order, performed in [place] hospital lab) Nasopharyngeal Nasopharyngeal Swab     Status: None   Collection Time: 03/26/20  2:30 AM   Specimen: Nasopharyngeal Swab  Result Value Ref Range Status   SARS Coronavirus 2 NEGATIVE NEGATIVE Final    Comment: (NOTE) SARS-CoV-2 target nucleic acids are NOT DETECTED.  The SARS-CoV-2 RNA is generally detectable in upper and lower respiratory specimens during the acute phase of infection. The lowest concentration of SARS-CoV-2 viral copies this assay can detect is 250 copies / mL. A negative result does not preclude SARS-CoV-2  infection and should not be used as the sole basis for treatment or other patient management decisions.  A negative result may occur with improper specimen collection / handling, submission of specimen other than nasopharyngeal swab, presence of viral mutation(s) within the areas targeted by this assay, and inadequate number of viral copies (<250 copies / mL). A negative result must be combined with clinical observations, patient history, and epidemiological information.  Fact Sheet for Patients:   StrictlyIdeas.no  Fact Sheet for Healthcare Providers: BankingDealers.co.za  This test is  not yet approved or  cleared by the [place] and has been authorized for detection and/or diagnosis of SARS-CoV-2 by FDA under an Emergency Use Authorization (EUA).  This EUA will remain in effect (meaning this test can be used) for the duration of the COVID-19 declaration under Section 564(b)(1) of the Act, 21 U.S.C. section 360bbb-3(b)(1), unless the authorization is terminated or revoked sooner.  Performed at [facility], [place] [address] [place], [place] [address]     Radiology Reports No results found.  Lab Data:  CBC: Recent Labs  Lab 03/25/20 1938 03/26/20 0649  WBC 6.1 4.1  HGB 7.0* 6.1*  HCT 23.4* 20.8*  MCV 85.4 86.0  PLT 224 539   Basic Metabolic Panel: Recent Labs  Lab 03/25/20 1938 03/26/20 0649  NA 139 141  K 3.1* 3.0*  CL 106 109  CO2 25 25  GLUCOSE 140* 121*  BUN 16 12  CREATININE 1.45* 0.93  CALCIUM 8.7* 8.2*   GFR: Estimated Creatinine Clearance: 40.7 mL/min (by C-G formula based on SCr of 0.93 mg/dL). Liver Function Tests: Recent Labs  Lab 03/25/20 1938  AST 23  ALT 16  ALKPHOS 59  BILITOT 0.5  PROT 6.9  ALBUMIN 3.5   No results for input(s): LIPASE, AMYLASE in the last 168 hours. No results for input(s): AMMONIA in the last 168 hours. Coagulation Profile: No results  for input(s): INR, PROTIME in the last 168 hours. Cardiac Enzymes: No results for input(s): CKTOTAL, CKMB, CKMBINDEX, TROPONINI in the last 168 hours. BNP (last 3 results) No results for input(s): PROBNP in the last 8760 hours. HbA1C: No results for input(s): HGBA1C in the last 72 hours. CBG: No results for input(s): GLUCAP in the last 168 hours. Lipid Profile: No results for input(s): CHOL, HDL, LDLCALC, TRIG, CHOLHDL, LDLDIRECT in the last 72 hours. Thyroid Function Tests: No results for input(s): TSH, T4TOTAL, FREET4, T3FREE, THYROIDAB in the last 72 hours. Anemia Panel: No results for input(s): VITAMINB12, FOLATE, FERRITIN, TIBC, IRON, RETICCTPCT in the last 72 hours. Urine analysis:    Component Value Date/Time   COLORURINE STRAW (A) 01/22/2020 2130   APPEARANCEUR CLEAR 01/22/2020 2130   LABSPEC 1.009 01/22/2020 2130   PHURINE 6.0 01/22/2020 2130   GLUCOSEU NEGATIVE 01/22/2020 2130   HGBUR NEGATIVE 01/22/2020 2130   BILIRUBINUR NEGATIVE 01/22/2020 2130   KETONESUR NEGATIVE 01/22/2020 2130   PROTEINUR NEGATIVE 01/22/2020 2130   NITRITE NEGATIVE 01/22/2020 2130   LEUKOCYTESUR NEGATIVE 01/22/2020 2130     Lori Rush M.D. [facility] 03/26/2020, 11:22 AM   Call night coverage person covering after 7pm

## 2020-03-26 NOTE — Progress Notes (Signed)
Patient due to have 2 blood transfusions. First transfusion was started, but both IV sites got occluded. Both IV's removed. Blood put in biohazzard bag and returned to blood bank. IV team consulted and IV site found by them via ultrasound.

## 2020-03-26 NOTE — Progress Notes (Signed)
Handbook reviewed with patient and skin intact upon admission.

## 2020-03-26 NOTE — Progress Notes (Signed)
CRITICAL VALUE ALERT  Critical Value:  Hgb 6.1  Date & Time Notied:  03/26/20  7416  Provider Notified: Tyler Pita  Orders Received/Actions taken: Start blood transfusion asap, administer both units, check CBC after administration. Monitor vitals and symptoms.

## 2020-03-27 DIAGNOSIS — K3189 Other diseases of stomach and duodenum: Secondary | ICD-10-CM

## 2020-03-27 DIAGNOSIS — D62 Acute posthemorrhagic anemia: Secondary | ICD-10-CM

## 2020-03-27 LAB — CBC
HCT: 31.3 % — ABNORMAL LOW (ref 36.0–46.0)
Hemoglobin: 10 g/dL — ABNORMAL LOW (ref 12.0–15.0)
MCH: 26.9 pg (ref 26.0–34.0)
MCHC: 31.9 g/dL (ref 30.0–36.0)
MCV: 84.1 fL (ref 80.0–100.0)
Platelets: 190 10*3/uL (ref 150–400)
RBC: 3.72 MIL/uL — ABNORMAL LOW (ref 3.87–5.11)
RDW: 17.8 % — ABNORMAL HIGH (ref 11.5–15.5)
WBC: 6.3 10*3/uL (ref 4.0–10.5)
nRBC: 0.6 % — ABNORMAL HIGH (ref 0.0–0.2)

## 2020-03-27 LAB — TYPE AND SCREEN
ABO/RH(D): B POS
Antibody Screen: NEGATIVE
Unit division: 0
Unit division: 0
Unit division: 0

## 2020-03-27 LAB — BASIC METABOLIC PANEL
Anion gap: 9 (ref 5–15)
BUN: 6 mg/dL — ABNORMAL LOW (ref 8–23)
CO2: 26 mmol/L (ref 22–32)
Calcium: 8.8 mg/dL — ABNORMAL LOW (ref 8.9–10.3)
Chloride: 107 mmol/L (ref 98–111)
Creatinine, Ser: 0.77 mg/dL (ref 0.44–1.00)
GFR calc Af Amer: >60 mL/min (ref >60)
GFR calc non Af Amer: >60 mL/min (ref >60)
Glucose, Bld: 99 mg/dL (ref 70–99)
Potassium: 3.2 mmol/L — ABNORMAL LOW (ref 3.5–5.1)
Sodium: 142 mmol/L (ref 135–145)

## 2020-03-27 LAB — BPAM RBC
Blood Product Expiration Date: 202109112359
Blood Product Expiration Date: 202109112359
Blood Product Expiration Date: 202110012359
ISSUE DATE / TIME: 202108270508
ISSUE DATE / TIME: 202108270757
ISSUE DATE / TIME: 202108271131
Unit Type and Rh: 1700
Unit Type and Rh: 7300
Unit Type and Rh: 7300

## 2020-03-27 LAB — GLUCOSE, CAPILLARY
Glucose-Capillary: 100 mg/dL — ABNORMAL HIGH (ref 70–99)
Glucose-Capillary: 85 mg/dL (ref 70–99)
Glucose-Capillary: 87 mg/dL (ref 70–99)

## 2020-03-27 MED ORDER — DIPHENHYDRAMINE HCL 25 MG PO CAPS: 25.0000 mg | ORAL_CAPSULE | Freq: Four times a day (QID) | ORAL | Status: DC | PRN

## 2020-03-27 MED ORDER — ENSURE ENLIVE PO LIQD
237.0000 mL | Freq: Two times a day (BID) | ORAL | Status: DC
  Administered 2020-03-28 – 2020-03-31 (×5): 237 mL via ORAL

## 2020-03-27 MED ORDER — POTASSIUM CHLORIDE CRYS ER 20 MEQ PO TBCR
40.0000 meq | EXTENDED_RELEASE_TABLET | Freq: Once | ORAL | Status: AC
  Administered 2020-03-27: 40 meq via ORAL
  Filled 2020-03-27: qty 2

## 2020-03-27 MED ORDER — ADULT MULTIVITAMIN W/MINERALS CH
1.0000 | ORAL_TABLET | Freq: Every day | ORAL | Status: DC
  Administered 2020-03-27 – 2020-04-01 (×5): 1 via ORAL
  Filled 2020-03-27 (×5): qty 1

## 2020-03-27 NOTE — Progress Notes (Signed)
Initial Nutrition Assessment  DOCUMENTATION CODES:   Not applicable  INTERVENTION:   Liberalize diet to REGULAR   Ensure Enlive po BID, each supplement provides 350 kcal and 20 grams of protein  MVI daily   NUTRITION DIAGNOSIS:   Increased nutrient needs related to acute illness as evidenced by estimated needs.  GOAL:   Patient will meet greater than or equal to 90% of their needs  MONITOR:   PO intake, Supplement acceptance, Weight trends, I & O's, Labs  REASON FOR ASSESSMENT:   Malnutrition Screening Tool    ASSESSMENT:   Patient with PMH significant for HTN, CKD II, H.Pylori gastritis, IDA, and EGD on 8/26 that revealed 2 cm submucosal gastric lesion . Presents this admission with GI bleed.   Plan possible gastric tumor excision early next week.   Pt denies loss in appetite PTA. Typically eats two meal daily that consist of plantains, fish, and vegetables (unable to elaborate further). Reports appetite has progressed well this admission. RD to provide supplementation to maximize kcal and protein.   Pt endorses a UBW of 130-135 lb and recent unintentional wt loss of 10 lb in the last few weeks. Records indicate pt weighed 137 lb on 3/18 and 129 lb this admission (5.8% wt loss in 5 months, insignificant for time frame).   Medications: 500 mg Vit C daily Labs: K 3.2 (L) CBG 85-113  Diet Order:   Diet Order            Diet Heart Room service appropriate? Yes; Fluid consistency: Thin  Diet effective now                 EDUCATION NEEDS:   Education needs have been addressed  Skin:  Skin Assessment: Reviewed RN Assessment  Last BM:  8/26  Height:   Ht Readings from Last 1 Encounters:  03/26/20 5\' 2"  (1.575 m)    Weight:   Wt Readings from Last 1 Encounters:  03/26/20 58.7 kg   BMI:  Body mass index is 23.67 kg/m.  Estimated Nutritional Needs:   Kcal:  1550-1750 kcal  Protein:  90-105 grams  Fluid:  >/= 1.5 L/day   Mariana Single RD,  LDN Clinical Nutrition Pager listed in [place]

## 2020-03-27 NOTE — Plan of Care (Signed)
Helping patiet to maintain adequate nutrition by encouraging her to eat despite her poor appetite. [name]

## 2020-03-27 NOTE — Progress Notes (Signed)
[facility]                                                                              Patient Demographics  Lori Rush, is a 74 y.o. female, DOB - 1945-10-08, NTI:[id number]  Admit date - 03/26/2020   Admitting Physician Berton Butrick Krystal Eaton, MD  Outpatient Primary MD for the patient is Glendon Axe, MD  Outpatient specialists:   LOS - 1  days   Medical records reviewed and are as summarized below:    Chief Complaint  Patient presents with  . abnormal labs       Brief summary   Patient is a 74 year old female with history of hypertension, CKD stage II, iron deficiency anemia, was admitted for symptomatic anemia in 12/2019.  At that time patient had undergone capsule endoscopy which did not show any reason for anemia.  Patient had endoscopy on 8/26 which showed normal esophagus, small hiatal hernia, abnormal lesion 2 cm, submucosal, no active bleeding, fresh blood in the duodenum, was irrigated but no etiology was found.  Patient was seen at PCPs office on 8/26 and was informed that her hemoglobin was very low and recommended ER evaluation and blood transfusion.  Patient also reported she had melanotic stools for last several weeks otherwise no abdominal pain.  She also reported increasing fatigue, generalized weakness, no chest pain or shortness of breath.   Assessment & Plan    Principal Problem:   Symptomatic anemia in the setting of upper GI bleed, chronic blood loss anemia, iron deficiency anemia -EGD outpatient showed submucosal abnormal lesion 2 cm, fresh blood in duodenum. 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   Disposition Plan:     Status is: Inpatient  The patient will require care spanning > 2 midnights and should be moved to inpatient because: Inpatient level of care appropriate due to severity of illness  Dispo: The patient is from: Home              Anticipated d/c is to: Home              Anticipated d/c date is: 1 day              Patient currently is not medically stable to d/c.  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 amLODipine  5 mg Oral Daily  . vitamin C  250 mg Oral Daily  . fluticasone  1 spray Each Nare Daily  . metoprolol tartrate  100 mg Oral BID  . pantoprazole  40 mg Oral BID AC   Continuous Infusions: . sodium chloride Stopped (03/26/20 1639)   PRN Meds:.sodium chloride, diphenhydrAMINE, hydrALAZINE, ondansetron **OR** ondansetron (ZOFRAN) IV      Subjective:   Lori Rush was seen and examined today.  Currently no active bleeding, no nausea vomiting or diarrhea.  Tolerating diet without any difficulty.  No acute issues overnight.  No fevers or chills. Patient denies dizziness, chest pain, shortness of breath, new weakness, numbess, tingling.  Objective:   Vitals:   03/26/20 1822 03/26/20 2122 03/27/20 0523 03/27/20 1403  BP: (!) 156/73 (!) 141/81  139/75 117/74  Pulse: 83 62 (!) 59 (!) 58  Resp: 20 16 16 18   Temp: 98.5 F (36.9 C) 98.5 F (36.9 C) 98.7 F (37.1 C) 97.7 F (36.5 C)  TempSrc: Oral Oral Oral Oral  SpO2: 100% 97% 98% 100%  Weight:      Height:        Intake/Output Summary (Last 24 hours) at 03/27/2020 1409 Last data filed at 03/27/2020 1330 Gross per 24 hour  Intake 1301.12 ml  Output --  Net 1301.12 ml     Wt Readings from Last 3 Encounters:  03/26/20 58.7 kg  01/23/20 60.7 kg  10/16/19 62.3 kg   Physical Exam  General: Alert and oriented x 3, NAD  Cardiovascular: S1 S2 clear, RRR. No pedal edema b/l  Respiratory: CTAB, no wheezing, rales or rhonchi  Gastrointestinal: Soft, nontender, nondistended, NBS  Ext: no pedal edema bilaterally  Neuro: no new deficits  Musculoskeletal: No cyanosis, clubbing  Skin: No rashes  Psych: Normal affect and demeanor, alert and oriented x3   Data Reviewed:  I have personally reviewed following labs and imaging studies  Micro Results Recent Results (from the past 240 hour(s))  SARS Coronavirus 2 by RT PCR (hospital order, performed in [facility] hospital lab) Nasopharyngeal Nasopharyngeal Swab     Status: None   Collection Time: 03/26/20  2:30 AM   Specimen: Nasopharyngeal Swab  Result Value Ref Range Status   SARS Coronavirus 2 NEGATIVE NEGATIVE Final    Comment: (NOTE) SARS-CoV-2 target nucleic acids are NOT DETECTED.  The SARS-CoV-2 RNA is generally detectable in upper and lower respiratory specimens during the acute phase of infection. The lowest concentration of SARS-CoV-2 viral copies this assay can detect is 250 copies / mL. A negative result does not preclude SARS-CoV-2 infection and should not be used as the sole basis for treatment or other patient management decisions.  A negative result may occur with improper specimen collection / handling, submission of specimen other than nasopharyngeal swab, presence of viral mutation(s) within  the areas targeted by this assay, and inadequate number of viral copies (<250 copies / mL). A negative result must be combined with clinical observations, patient history, and epidemiological information.  Fact Sheet for Patients:   StrictlyIdeas.no  Fact Sheet for Healthcare Providers: BankingDealers.co.za  This test is not yet approved or  cleared by the [place] FDA and has been authorized for detection and/or diagnosis of SARS-CoV-2 by FDA under an Emergency Use Authorization (EUA).  This EUA  will remain in effect (meaning this test can be used) for the duration of the COVID-19 declaration under Section 564(b)(1) of the Act, 21 U.S.C. section 360bbb-3(b)(1), unless the authorization is terminated or revoked sooner.  Performed at [facility], [place] [address], [place], [place] [address]     Radiology Reports CT CHEST W CONTRAST  Result Date: 03/26/2020 CLINICAL DATA:  Submucosal mass on EGD EXAM: CT CHEST, ABDOMEN, AND PELVIS WITH CONTRAST TECHNIQUE: Multidetector CT imaging of the chest, abdomen and pelvis was performed following the standard protocol during bolus administration of intravenous contrast. CONTRAST:  163mL OMNIPAQUE IOHEXOL 300 MG/ML  SOLN COMPARISON:  None FINDINGS: CT CHEST FINDINGS Cardiovascular: Calcified and noncalcified plaque throughout the thoracic aorta. No aneurysmal dilation. Central pulmonary vasculature on venous phase assessment is unremarkable. Heart size mildly enlarged with no pericardial effusion. Mediastinum/Nodes: Thoracic inlet structures are unremarkable. No axillary lymphadenopathy. No mediastinal adenopathy. No hilar adenopathy. Lungs/Pleura: Airways are patent. Mild atelectasis. Small pulmonary nodule in the LEFT lung base 7 x 6 mm (image 120, series 4) Musculoskeletal: No acute or destructive bone process relative to the bony thorax. See below for full musculoskeletal details.  CT ABDOMEN PELVIS FINDINGS Hepatobiliary: No focal, suspicious hepatic lesion. The portal vein is patent. Mild biliary duct dilation to approximately 5-6 mm is nonspecific. No pericholecystic stranding or fluid. No gallbladder distension. Pancreas: Pancreas is normal without ductal dilation or inflammation. Spleen: Normal in size and contour. Adrenals/Urinary Tract: Adrenal glands are normal. Renal enhancement is symmetric. 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 Two ureters are present to at least the level of the distal third of the RIGHT ureter. Stomach/Bowel: Lesion along the lesser curvature of the stomach with rounded, well-circumscribed margins and heterogeneous enhancement measuring 5.8 x 4.8 x 5.3 cm reportedly submucosal on endoscopic evaluation extending into the hepatic gastric ligament. Ulceration and or site of biopsy along the inferior margin. Small-bowel without acute process. Appendix is normal. Vascular/Lymphatic: Calcified atheromatous plaque in the abdominal aorta. No adenopathy in the retroperitoneum. No upper abdominal lymphadenopathy. Reproductive: Bulky uterus with multiple enhancing masses and with a calcified fibroid along the RIGHT uterus. One of these exophytic heterogeneous masses is dumbbell-shaped and extends towards the LEFT adnexa. Area is not well characterized on CT some of these measuring as large as 4.3 x 4.5 cm. Other: No ascites. There is however a small ventral hernia containing fluid density just below the xiphoid, of uncertain significance given the absence of ascites or the presence of morphologic changes of liver disease. Musculoskeletal: Spinal degenerative changes. Dextroconvex curvature of the spine. L5-S1 anterolisthesis, grade 2 associated with degenerative changes. IMPRESSION: 1. 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 2. Bulky appearance of the uterus with  heterogeneous masslike areas more likely fibroids particularly given the calcified appearance of the RIGHT uterus. One of these lesions displays a more lobular morphology and extends towards the LEFT adnexa. Leiomyomata are considered. In this 75 year old female would correlate with any changes in pelvic symptoms, bleeding or enlargement of the uterus noted on physical examination. Follow-up pelvic MRI may be beneficial given the atypical features of what may be a large subserosal leiomyoma along the LEFT fundus to exclude presence of malignant transformation. 3. Small ventral hernia containing fluid density just below the xiphoid, of uncertain significance given the absence of ascites or the presence of morphologic changes of liver disease. Focused ultrasound of  this area may be helpful. No soft tissue nodularity is visible on CT. 4. Small pulmonary nodule in the LEFT lung base 7 x 6 mm. Close attention on follow-up is suggested. This remains nonspecific but suspicious given other findings. 5. collecting systems of the RIGHT kidney are dilated with 2 ureters to at least the level of the sacral promontory. Findings may reflect chronic reflux and mild obstruction. Correlate with any symptoms and consider follow-up renal sonogram to assess for any changes over time. 6. Aortic atherosclerosis. These results will be called to the ordering clinician or representative by the Radiologist Assistant, and communication documented in the PACS or [employer]. Aortic Atherosclerosis (ICD10-I70.0). Electronically Signed   By: Zetta Bills M.D.   On: 03/26/2020 17:55   CT ABDOMEN PELVIS W CONTRAST  Result Date: 03/26/2020 CLINICAL DATA:  Submucosal mass on EGD EXAM: CT CHEST, ABDOMEN, AND PELVIS WITH CONTRAST TECHNIQUE: Multidetector CT imaging of the chest, abdomen and pelvis was performed following the standard protocol during bolus administration of intravenous contrast. CONTRAST:  144mL OMNIPAQUE IOHEXOL 300 MG/ML   SOLN COMPARISON:  None FINDINGS: CT CHEST FINDINGS Cardiovascular: Calcified and noncalcified plaque throughout the thoracic aorta. No aneurysmal dilation. Central pulmonary vasculature on venous phase assessment is unremarkable. Heart size mildly enlarged with no pericardial effusion. Mediastinum/Nodes: Thoracic inlet structures are unremarkable. No axillary lymphadenopathy. No mediastinal adenopathy. No hilar adenopathy. Lungs/Pleura: Airways are patent. Mild atelectasis. Small pulmonary nodule in the LEFT lung base 7 x 6 mm (image 120, series 4) Musculoskeletal: No acute or destructive bone process relative to the bony thorax. See below for full musculoskeletal details. CT ABDOMEN PELVIS FINDINGS Hepatobiliary: No focal, suspicious hepatic lesion. The portal vein is patent. Mild biliary duct dilation to approximately 5-6 mm is nonspecific. No pericholecystic stranding or fluid. No gallbladder distension. Pancreas: Pancreas is normal without ductal dilation or inflammation. Spleen: Normal in size and contour. Adrenals/Urinary Tract: Adrenal glands are normal. Renal enhancement is symmetric. 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 Two ureters are present to at least the level of the distal third of the RIGHT ureter. Stomach/Bowel: Lesion along the lesser curvature of the stomach with rounded, well-circumscribed margins and heterogeneous enhancement measuring 5.8 x 4.8 x 5.3 cm reportedly submucosal on endoscopic evaluation extending into the hepatic gastric ligament. Ulceration and or site of biopsy along the inferior margin. Small-bowel without acute process. Appendix is normal. Vascular/Lymphatic: Calcified atheromatous plaque in the abdominal aorta. No adenopathy in the retroperitoneum. No upper abdominal lymphadenopathy. Reproductive: Bulky uterus with multiple enhancing masses and with a calcified fibroid along the  RIGHT uterus. One of these exophytic heterogeneous masses is dumbbell-shaped and extends towards the LEFT adnexa. Area is not well characterized on CT some of these measuring as large as 4.3 x 4.5 cm. Other: No ascites. There is however a small ventral hernia containing fluid density just below the xiphoid, of uncertain significance given the absence of ascites or the presence of morphologic changes of liver disease. Musculoskeletal: Spinal degenerative changes. Dextroconvex curvature of the spine. L5-S1 anterolisthesis, grade 2 associated with degenerative changes. IMPRESSION: 1. 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 2. Bulky appearance of the uterus with heterogeneous masslike areas more likely fibroids particularly given the calcified appearance of the RIGHT uterus. One of these lesions displays a more lobular morphology and extends towards the LEFT adnexa. Leiomyomata are considered. In this 74 year old female  would correlate with any changes in pelvic symptoms, bleeding or enlargement of the uterus noted on physical examination. Follow-up pelvic MRI may be beneficial given the atypical features of what may be a large subserosal leiomyoma along the LEFT fundus to exclude presence of malignant transformation. 3. Small ventral hernia containing fluid density just below the xiphoid, of uncertain significance given the absence of ascites or the presence of morphologic changes of liver disease. Focused ultrasound of this area may be helpful. No soft tissue nodularity is visible on CT. 4. Small pulmonary nodule in the LEFT lung base 7 x 6 mm. Close attention on follow-up is suggested. This remains nonspecific but suspicious given other findings. 5. collecting systems of the RIGHT kidney are dilated with 2 ureters to at least the level of the sacral promontory. Findings may reflect chronic reflux and mild obstruction. Correlate with any symptoms and consider follow-up renal sonogram to assess for  any changes over time. 6. Aortic atherosclerosis. These results will be called to the ordering clinician or representative by the Radiologist Assistant, and communication documented in the PACS or [employer]. Aortic Atherosclerosis (ICD10-I70.0). Electronically Signed   By: Zetta Bills M.D.   On: 03/26/2020 17:55    Lab Data:  CBC: Recent Labs  Lab 03/25/20 1938 03/26/20 0649 03/26/20 1719 03/27/20 0336  WBC 6.1 4.1  --  6.3  HGB 7.0* 6.1* 11.2* 10.0*  HCT 23.4* 20.8* 36.2 31.3*  MCV 85.4 86.0  --  84.1  PLT 224 193  --  811   Basic Metabolic Panel: Recent Labs  Lab 03/25/20 1938 03/26/20 0649 03/27/20 0336  NA 139 141 142  K 3.1* 3.0* 3.2*  CL 106 109 107  CO2 25 25 26   GLUCOSE 140* 121* 99  BUN 16 12 6*  CREATININE 1.45* 0.93 0.77  CALCIUM 8.7* 8.2* 8.8*   GFR: Estimated Creatinine Clearance: 47.3 mL/min (by C-G formula based on SCr of 0.77 mg/dL). Liver Function Tests: Recent Labs  Lab 03/25/20 1938  AST 23  ALT 16  ALKPHOS 59  BILITOT 0.5  PROT 6.9  ALBUMIN 3.5   No results for input(s): LIPASE, AMYLASE in the last 168 hours. No results for input(s): AMMONIA in the last 168 hours. Coagulation Profile: No results for input(s): INR, PROTIME in the last 168 hours. Cardiac Enzymes: No results for input(s): CKTOTAL, CKMB, CKMBINDEX, TROPONINI in the last 168 hours. BNP (last 3 results) No results for input(s): PROBNP in the last 8760 hours. HbA1C: No results for input(s): HGBA1C in the last 72 hours. CBG: Recent Labs  Lab 03/26/20 1713 03/26/20 2348 03/27/20 0738  GLUCAP 113* 90 85   Lipid Profile: No results for input(s): CHOL, HDL, LDLCALC, TRIG, CHOLHDL, LDLDIRECT in the last 72 hours. Thyroid Function Tests: No results for input(s): TSH, T4TOTAL, FREET4, T3FREE, THYROIDAB in the last 72 hours. Anemia Panel: No results for input(s): VITAMINB12, FOLATE, FERRITIN, TIBC, IRON, RETICCTPCT in the last 72 hours. Urine analysis:    Component  Value Date/Time   COLORURINE STRAW (A) 01/22/2020 2130   APPEARANCEUR CLEAR 01/22/2020 2130   LABSPEC 1.009 01/22/2020 2130   PHURINE 6.0 01/22/2020 2130   GLUCOSEU NEGATIVE 01/22/2020 2130   HGBUR NEGATIVE 01/22/2020 2130   BILIRUBINUR NEGATIVE 01/22/2020 2130   KETONESUR NEGATIVE 01/22/2020 2130   PROTEINUR NEGATIVE 01/22/2020 2130   NITRITE NEGATIVE 01/22/2020 2130   LEUKOCYTESUR NEGATIVE 01/22/2020 2130     Dakin Madani M.D. [facility] 03/27/2020, 2:09 PM   Call night coverage person  covering after 7pm

## 2020-03-27 NOTE — Plan of Care (Signed)
  Problem: Clinical Measurements: Goal: Ability to maintain clinical measurements within normal limits will improve Outcome: Progressing   

## 2020-03-27 NOTE — Progress Notes (Signed)
[facility] Progress Note  Chief Complaint: Melena  History:  Lori Rush feels pretty well today, and recalls our findings on upper endoscopy yesterday.  She had some liquid black stool last evening again.  She denies abdominal pain, chest pain, dyspnea or dysuria.  Remainder of systems negative except for the previously noted 10 pound weight loss in the last few weeks.  Objective:   Current Facility-Administered Medications:  .  0.9 %  sodium chloride infusion, , Intravenous, PRN, Rai, Vernelle Emerald, MD, Stopped at 03/26/20 1639 .  amLODipine (NORVASC) tablet 5 mg, 5 mg, Oral, Daily, Danis, Estill Cotta III, MD, 5 mg at 03/27/20 0827 .  ascorbic acid (VITAMIN C) tablet 250 mg, 250 mg, Oral, Daily, Danis, Estill Cotta III, MD, 250 mg at 03/27/20 0827 .  diphenhydrAMINE (BENADRYL) injection 25 mg, 25 mg, Intravenous, Q6H PRN, Rai, Ripudeep K, MD, 25 mg at 03/26/20 1752 .  fluticasone (FLONASE) 50 MCG/ACT nasal spray 1 spray, 1 spray, Each Nare, Daily, Danis, Estill Cotta III, MD, 1 spray at 03/26/20 1201 .  hydrALAZINE (APRESOLINE) injection 10 mg, 10 mg, Intravenous, Q4H PRN, Nelida Meuse III, MD, 10 mg at 03/26/20 1605 .  metoprolol tartrate (LOPRESSOR) tablet 100 mg, 100 mg, Oral, BID, Nelida Meuse III, MD, 100 mg at 03/27/20 0827 .  ondansetron (ZOFRAN) tablet 4 mg, 4 mg, Oral, Q6H PRN **OR** ondansetron (ZOFRAN) injection 4 mg, 4 mg, Intravenous, Q6H PRN, Danis, Majid Mccravy L III, MD .  pantoprazole (PROTONIX) EC tablet 40 mg, 40 mg, Oral, BID AC, Nelida Meuse III, MD, 40 mg at 03/27/20 5631  . sodium chloride Stopped (03/26/20 1639)     Vital signs in last 24 hrs: Vitals:   03/26/20 2122 03/27/20 0523  BP: (!) 141/81 139/75  Pulse: 62 (!) 59  Resp: 16 16  Temp: 98.5 F (36.9 C) 98.7 F (37.1 C)  SpO2: 97% 98%    Intake/Output Summary (Last 24 hours) at 03/27/2020 0847 Last data filed at 03/27/2020 0600 Gross per 24 hour  Intake 1601.12 ml  Output --  Net 1601.12 ml     Physical  Exam   HEENT: sclera anicteric, oral mucosa without lesions  Neck: supple, no thyromegaly, JVD or lymphadenopathy  Cardiac: RRR without murmurs, S1S2 heard, no peripheral edema  Pulm: clear to auscultation bilaterally, normal RR and effort noted  Abdomen: soft, no tenderness, with active bowel sounds. No guarding or palpable hepatosplenomegaly  Skin; warm and dry, no jaundice  Recent Labs:  CBC Latest Ref Rng & Units 03/27/2020 03/26/2020 03/26/2020  WBC 4.0 - 10.5 K/uL 6.3 - 4.1  Hemoglobin 12.0 - 15.0 g/dL 10.0(L) 11.2(L) 6.1(LL)  Hematocrit 36 - 46 % 31.3(L) 36.2 20.8(L)  Platelets 150 - 400 K/uL 190 - 193    No results for input(s): INR in the last 168 hours. CMP Latest Ref Rng & Units 03/27/2020 03/26/2020 03/25/2020  Glucose 70 - 99 mg/dL 99 121(H) 140(H)  BUN 8 - 23 mg/dL 6(L) 12 16  Creatinine 0.44 - 1.00 mg/dL 0.77 0.93 1.45(H)  Sodium 135 - 145 mmol/L 142 141 139  Potassium 3.5 - 5.1 mmol/L 3.2(L) 3.0(L) 3.1(L)  Chloride 98 - 111 mmol/L 107 109 106  CO2 22 - 32 mmol/L 26 25 25   Calcium 8.9 - 10.3 mg/dL 8.8(L) 8.2(L) 8.7(L)  Total Protein 6.5 - 8.1 g/dL - - 6.9  Total Bilirubin 0.3 - 1.2 mg/dL - - 0.5  Alkaline Phos 38 - 126 U/L - - 59  AST  15 - 41 U/L - - 23  ALT 0 - 44 U/L - - 16     Radiologic studies: CLINICAL DATA:  Submucosal mass on EGD   EXAM: CT CHEST, ABDOMEN, AND PELVIS WITH CONTRAST   TECHNIQUE: Multidetector CT imaging of the chest, abdomen and pelvis was performed following the standard protocol during bolus administration of intravenous contrast.   CONTRAST:  120mL OMNIPAQUE IOHEXOL 300 MG/ML  SOLN   COMPARISON:  None   FINDINGS: CT CHEST FINDINGS   Cardiovascular: Calcified and noncalcified plaque throughout the thoracic aorta. No aneurysmal dilation. Central pulmonary vasculature on venous phase assessment is unremarkable. Heart size mildly enlarged with no pericardial effusion.   Mediastinum/Nodes: Thoracic inlet structures are  unremarkable.   No axillary lymphadenopathy. No mediastinal adenopathy. No hilar adenopathy.   Lungs/Pleura: Airways are patent. Mild atelectasis. Small pulmonary nodule in the LEFT lung base 7 x 6 mm (image 120, series 4)   Musculoskeletal: No acute or destructive bone process relative to the bony thorax. See below for full musculoskeletal details.   CT ABDOMEN PELVIS FINDINGS   Hepatobiliary: No focal, suspicious hepatic lesion. The portal vein is patent. Mild biliary duct dilation to approximately 5-6 mm is nonspecific. No pericholecystic stranding or fluid. No gallbladder distension.   Pancreas: Pancreas is normal without ductal dilation or inflammation.   Spleen: Normal in size and contour.   Adrenals/Urinary Tract: Adrenal glands are normal.   Renal enhancement is symmetric. 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 Two ureters are present to at least the level of the distal third of the RIGHT ureter.   Stomach/Bowel: Lesion along the lesser curvature of the stomach with rounded, well-circumscribed margins and heterogeneous enhancement measuring 5.8 x 4.8 x 5.3 cm reportedly submucosal on endoscopic evaluation extending into the hepatic gastric ligament. Ulceration and or site of biopsy along the inferior margin. Small-bowel without acute process.   Appendix is normal.   Vascular/Lymphatic: Calcified atheromatous plaque in the abdominal aorta. No adenopathy in the retroperitoneum. No upper abdominal lymphadenopathy.   Reproductive: Bulky uterus with multiple enhancing masses and with a calcified fibroid along the RIGHT uterus.   One of these exophytic heterogeneous masses is dumbbell-shaped and extends towards the LEFT adnexa. Area is not well characterized on CT some of these measuring as large as 4.3 x 4.5 cm.   Other: No ascites.   There is however a small ventral  hernia containing fluid density just below the xiphoid, of uncertain significance given the absence of ascites or the presence of morphologic changes of liver disease.   Musculoskeletal: Spinal degenerative changes. Dextroconvex curvature of the spine. L5-S1 anterolisthesis, grade 2 associated with degenerative changes.   IMPRESSION: 1. 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 2. Bulky appearance of the uterus with heterogeneous masslike areas more likely fibroids particularly given the calcified appearance of the RIGHT uterus. One of these lesions displays a more lobular morphology and extends towards the LEFT adnexa. Leiomyomata are considered. In this 74 year old female would correlate with any changes in pelvic symptoms, bleeding or enlargement of the uterus noted on physical examination. Follow-up pelvic MRI may be beneficial given the atypical features of what may be a large subserosal leiomyoma along the LEFT fundus to exclude presence of malignant transformation. 3. Small ventral hernia containing fluid density just below the xiphoid, of uncertain significance given the absence of ascites or the presence  of morphologic changes of liver disease. Focused ultrasound of this area may be helpful. No soft tissue nodularity is visible on CT. 4. Small pulmonary nodule in the LEFT lung base 7 x 6 mm. Close attention on follow-up is suggested. This remains nonspecific but suspicious given other findings. 5. collecting systems of the RIGHT kidney are dilated with 2 ureters to at least the level of the sacral promontory. Findings may reflect chronic reflux and mild obstruction. Correlate with any symptoms and consider follow-up renal sonogram to assess for any changes over time. 6. Aortic atherosclerosis.   These results will be called to the ordering clinician or representative by the Radiologist Assistant, and communication documented in the PACS or [employer].   Aortic Atherosclerosis (ICD10-I70.0).     Electronically Signed   By: Zetta Bills M.D.   On: 03/26/2020 17:55  (I personally reviewed the CT images)  Assessment & Plan  Assessment: Melena Acute on chronic blood loss anemia. Gastric mass, highly suspicious for GIST.  This mass is the source of bleeding because it appears to have become centrally necrotic and possibly malignant.  I took some biopsies from the edge of its central depression, but I suspect they are likely to be nondiagnostic.  Really, because of its size and the persistent bleeding it is causing, I feel need surgical resection. I spoke to Dr. Marlou Starks general surgery today, who has agreed to see the patient in consultation.  Meanwhile, regular diet, twice daily hemoglobin checks, oral PPI and continue hospital monitoring.   35 minutes were spent on this encounter (including chart review, history/exam, counseling/coordination of care, and documentation)   Nelida Meuse III Office: [phone]

## 2020-03-27 NOTE — Consult Note (Signed)
Reason for Consult: Blood in stool Referring Physician: Dr. Irineo Axon Gasser is an 74 y.o. female.  HPI: The patient is a 74 year old black female who is hospitalized for blood in her stool.  The patient states that this has been occurring since March of this year.  She describes the stool as dark and bloody.  She denies any abdominal pain.  She denies any nausea or vomiting.  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  She is currently hemodynamically stable with no complaints.  Her only history of past surgery was many years ago for a ruptured ectopic pregnancy.  Past Medical History:  Diagnosis Date  . Hypertension     Past Surgical History:  Procedure Laterality Date  . GIVENS CAPSULE STUDY N/A 01/24/2020   Procedure: GIVENS CAPSULE STUDY;  Surgeon: Yetta Flock, MD;  Location: [facility];  Service: Gastroenterology;  Laterality: N/A;    Family History  Problem Relation Age of Onset  . Diabetes Mother   . Diabetes Father   . Hypertension Father     Social History:  reports that she has never smoked. She has never used smokeless tobacco. She reports that she does not drink alcohol and does not use drugs.  Allergies: No Known Allergies  Medications: I have reviewed the patient's current medications.  Results for orders placed or performed during the hospital encounter of 03/26/20 (from the past 48 hour(s))  Comprehensive metabolic panel     Status: Abnormal   Collection Time: 03/25/20  7:38 PM  Result Value Ref Range   Sodium 139 135 - 145 mmol/L   Potassium 3.1 (L) 3.5 - 5.1 mmol/L   Chloride 106 98 - 111 mmol/L   CO2 25 22 - 32 mmol/L   Glucose, Bld 140 (H) 70 - 99 mg/dL    Comment: Glucose reference range applies only to samples taken after fasting for at least 8 hours.   BUN 16 8 - 23 mg/dL   Creatinine, Ser 1.45 (H)  0.44 - 1.00 mg/dL   Calcium 8.7 (L) 8.9 - 10.3 mg/dL   Total Protein 6.9 6.5 - 8.1 g/dL   Albumin 3.5 3.5 - 5.0 g/dL   AST 23 15 - 41 U/L   ALT 16 0 - 44 U/L   Alkaline Phosphatase 59 38 - 126 U/L   Total Bilirubin 0.5 0.3 - 1.2 mg/dL   GFR calc non Af Amer 35 (L) >60 mL/min   GFR calc Af Amer 40 (L) >60 mL/min   Anion gap 8 5 - 15    Comment: Performed at [facility], [place] [address], [place], [place] [address]  CBC     Status: Abnormal   Collection Time: 03/25/20  7:38 PM  Result Value Ref Range   WBC 6.1 4.0 - 10.5 K/uL   RBC 2.74 (L) 3.87 - 5.11 MIL/uL   Hemoglobin 7.0 (L) 12.0 - 15.0 g/dL   HCT 23.4 (L) 36 - 46 %   MCV 85.4 80.0 - 100.0 fL   MCH 25.5 (L) 26.0 - 34.0 pg   MCHC 29.9 (L) 30.0 - 36.0 g/dL   RDW 19.2 (H) 11.5 - 15.5 %   Platelets 224 150 - 400 K/uL   nRBC 0.3 (H) 0.0 - 0.2 %    Comment: Performed at [facility], [place] [address] [place], [place] [address]  Type and screen [place]     Status: None   Collection Time: 03/25/20  7:38 PM  Result Value Ref Range   ABO/RH(D) B POS    Antibody Screen NEG    Sample Expiration 03/28/2020,2359    Unit Number [id number]    Blood Component Type RED CELLS,LR    Unit division 00    Status of Unit DISCARDED    Transfusion Status OK TO TRANSFUSE    Crossmatch Result Compatible    Unit Number [id number]    Blood Component Type RBC LR PHER1    Unit division 00    Status of Unit ISSUED,FINAL    Transfusion Status OK TO TRANSFUSE    Crossmatch Result      Compatible Performed at [facility], [place] [address], [place], [place] [address]    Unit Number [id number]    Blood Component Type RED CELLS,LR    Unit division 00    Status of Unit ISSUED,FINAL    Transfusion Status OK TO TRANSFUSE    Crossmatch Result Compatible   Prepare RBC (crossmatch)     Status: None   Collection Time: 03/26/20  1:00 AM  Result Value Ref Range   Order  Confirmation      ORDER PROCESSED BY BLOOD BANK Performed at [facility], [place] [address], [place], [place] [address]   POC occult blood, ED     Status: None   Collection Time: 03/26/20  1:11 AM  Result Value Ref Range   Fecal Occult Bld NEGATIVE NEGATIVE  SARS Coronavirus 2 by RT PCR (hospital order, performed in [facility] hospital lab) Nasopharyngeal Nasopharyngeal Swab     Status: None   Collection Time: 03/26/20  2:30 AM   Specimen: Nasopharyngeal Swab  Result Value Ref Range   SARS Coronavirus 2 NEGATIVE NEGATIVE    Comment: (NOTE) SARS-CoV-2 target nucleic acids are NOT DETECTED.  The SARS-CoV-2 RNA is generally detectable in upper and lower respiratory specimens during the acute phase of infection. The lowest concentration of SARS-CoV-2 viral copies this assay can detect is 250 copies / mL. A negative result does not preclude SARS-CoV-2 infection and should not be used as the sole basis for treatment or other patient management decisions.  A negative result may occur with improper specimen collection / handling, submission of specimen other than nasopharyngeal swab, presence of viral mutation(s) within the areas targeted by this assay, and inadequate number of viral copies (<250 copies / mL). A negative result must be combined with clinical observations, patient history, and epidemiological information.  Fact Sheet for Patients:   StrictlyIdeas.no  Fact Sheet for Healthcare Providers: BankingDealers.co.za  This test is not yet approved or  cleared by the [place] FDA and has been authorized for detection and/or diagnosis of SARS-CoV-2 by FDA under an Emergency Use Authorization (EUA).  This EUA will remain in effect (meaning this test can be used) for the duration of the COVID-19 declaration under Section 564(b)(1) of the Act, 21 U.S.C. section 360bbb-3(b)(1), unless the authorization is terminated  or revoked sooner.  Performed at [facility], [place] [address], [place],  [address]   Basic metabolic panel     Status: Abnormal   Collection Time: 03/26/20  6:49 AM  Result Value Ref Range   Sodium 141 135 - 145 mmol/L   Potassium 3.0 (L) 3.5 - 5.1 mmol/L   Chloride 109 98 - 111 mmol/L  CO2 25 22 - 32 mmol/L   Glucose, Bld 121 (H) 70 - 99 mg/dL    Comment: Glucose reference range applies only to samples taken after fasting for at least 8 hours.   BUN 12 8 - 23 mg/dL   Creatinine, Ser 0.93 0.44 - 1.00 mg/dL   Calcium 8.2 (L) 8.9 - 10.3 mg/dL   GFR calc non Af Amer 60 (L) >60 mL/min   GFR calc Af Amer >60 >60 mL/min   Anion gap 7 5 - 15    Comment: Performed at [facility], [place] [address] [place], [place] [address]  CBC     Status: Abnormal   Collection Time: 03/26/20  6:49 AM  Result Value Ref Range   WBC 4.1 4.0 - 10.5 K/uL   RBC 2.42 (L) 3.87 - 5.11 MIL/uL   Hemoglobin 6.1 (LL) 12.0 - 15.0 g/dL    Comment: REPEATED TO VERIFY THIS CRITICAL RESULT HAS VERIFIED AND BEEN CALLED TO TAYLOR,J BY POTEAT,SHANNON ON 08 27 2021 AT 0740, AND HAS BEEN READ BACK. CRITICAL RESULT VERIFIED    HCT 20.8 (L) 36 - 46 %   MCV 86.0 80.0 - 100.0 fL   MCH 25.2 (L) 26.0 - 34.0 pg   MCHC 29.3 (L) 30.0 - 36.0 g/dL   RDW 18.9 (H) 11.5 - 15.5 %   Platelets 193 150 - 400 K/uL   nRBC 0.0 0.0 - 0.2 %    Comment: Performed at [facility], [place] [address], [place], [place] [address]  Glucose, capillary     Status: Abnormal   Collection Time: 03/26/20  5:13 PM  Result Value Ref Range   Glucose-Capillary 113 (H) 70 - 99 mg/dL    Comment: Glucose reference range applies only to samples taken after fasting for at least 8 hours.  Hemoglobin and hematocrit, blood     Status: Abnormal   Collection Time: 03/26/20  5:19 PM  Result Value Ref Range   Hemoglobin 11.2 (L) 12.0 - 15.0 g/dL    Comment: POST TRANSFUSION SPECIMEN   HCT 36.2 36 - 46 %     Comment: Performed at [facility], [place] [address] [place], [place] [address]  Glucose, capillary     Status: None   Collection Time: 03/26/20 11:48 PM  Result Value Ref Range   Glucose-Capillary 90 70 - 99 mg/dL    Comment: Glucose reference range applies only to samples taken after fasting for at least 8 hours.  CBC     Status: Abnormal   Collection Time: 03/27/20  3:36 AM  Result Value Ref Range   WBC 6.3 4.0 - 10.5 K/uL   RBC 3.72 (L) 3.87 - 5.11 MIL/uL   Hemoglobin 10.0 (L) 12.0 - 15.0 g/dL   HCT 31.3 (L) 36 - 46 %   MCV 84.1 80.0 - 100.0 fL   MCH 26.9 26.0 - 34.0 pg   MCHC 31.9 30.0 - 36.0 g/dL   RDW 17.8 (H) 11.5 - 15.5 %   Platelets 190 150 - 400 K/uL   nRBC 0.6 (H) 0.0 - 0.2 %    Comment: Performed at [facility], [place] [address], [place], [place] [address]  Basic metabolic panel     Status: Abnormal   Collection Time: 03/27/20  3:36 AM  Result Value Ref Range   Sodium 142 135 - 145 mmol/L   Potassium 3.2 (L) 3.5 - 5.1 mmol/L   Chloride 107 98 - 111 mmol/L  CO2 26 22 - 32 mmol/L   Glucose, Bld 99 70 - 99 mg/dL    Comment: Glucose reference range applies only to samples taken after fasting for at least 8 hours.   BUN 6 (L) 8 - 23 mg/dL   Creatinine, Ser 0.77 0.44 - 1.00 mg/dL   Calcium 8.8 (L) 8.9 - 10.3 mg/dL   GFR calc non Af Amer >60 >60 mL/min   GFR calc Af Amer >60 >60 mL/min   Anion gap 9 5 - 15    Comment: Performed at [facility], [place] [address] [place], [place] [address]  Glucose, capillary     Status: None   Collection Time: 03/27/20  7:38 AM  Result Value Ref Range   Glucose-Capillary 85 70 - 99 mg/dL    Comment: Glucose reference range applies only to samples taken after fasting for at least 8 hours.    CT CHEST W CONTRAST  Result Date: 03/26/2020 CLINICAL DATA:  Submucosal mass on EGD EXAM: CT CHEST, ABDOMEN, AND PELVIS WITH CONTRAST TECHNIQUE: Multidetector CT imaging of the chest,  abdomen and pelvis was performed following the standard protocol during bolus administration of intravenous contrast. CONTRAST:  16mL OMNIPAQUE IOHEXOL 300 MG/ML  SOLN COMPARISON:  None FINDINGS: CT CHEST FINDINGS Cardiovascular: Calcified and noncalcified plaque throughout the thoracic aorta. No aneurysmal dilation. Central pulmonary vasculature on venous phase assessment is unremarkable. Heart size mildly enlarged with no pericardial effusion. Mediastinum/Nodes: Thoracic inlet structures are unremarkable. No axillary lymphadenopathy. No mediastinal adenopathy. No hilar adenopathy. Lungs/Pleura: Airways are patent. Mild atelectasis. Small pulmonary nodule in the LEFT lung base 7 x 6 mm (image 120, series 4) Musculoskeletal: No acute or destructive bone process relative to the bony thorax. See below for full musculoskeletal details. CT ABDOMEN PELVIS FINDINGS Hepatobiliary: No focal, suspicious hepatic lesion. The portal vein is patent. Mild biliary duct dilation to approximately 5-6 mm is nonspecific. No pericholecystic stranding or fluid. No gallbladder distension. Pancreas: Pancreas is normal without ductal dilation or inflammation. Spleen: Normal in size and contour. Adrenals/Urinary Tract: Adrenal glands are normal. Renal enhancement is symmetric. 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 Two ureters are present to at least the level of the distal third of the RIGHT ureter. Stomach/Bowel: Lesion along the lesser curvature of the stomach with rounded, well-circumscribed margins and heterogeneous enhancement measuring 5.8 x 4.8 x 5.3 cm reportedly submucosal on endoscopic evaluation extending into the hepatic gastric ligament. Ulceration and or site of biopsy along the inferior margin. Small-bowel without acute process. Appendix is normal. Vascular/Lymphatic: Calcified atheromatous plaque in the abdominal aorta. No  adenopathy in the retroperitoneum. No upper abdominal lymphadenopathy. Reproductive: Bulky uterus with multiple enhancing masses and with a calcified fibroid along the RIGHT uterus. One of these exophytic heterogeneous masses is dumbbell-shaped and extends towards the LEFT adnexa. Area is not well characterized on CT some of these measuring as large as 4.3 x 4.5 cm. Other: No ascites. There is however a small ventral hernia containing fluid density just below the xiphoid, of uncertain significance given the absence of ascites or the presence of morphologic changes of liver disease. Musculoskeletal: Spinal degenerative changes. Dextroconvex curvature of the spine. L5-S1 anterolisthesis, grade 2 associated with degenerative changes. IMPRESSION: 1. 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 2. Bulky appearance of the uterus with heterogeneous masslike areas more likely fibroids particularly given the calcified appearance of  the RIGHT uterus. One of these lesions displays a more lobular morphology and extends towards the LEFT adnexa. Leiomyomata are considered. In this 74 year old female would correlate with any changes in pelvic symptoms, bleeding or enlargement of the uterus noted on physical examination. Follow-up pelvic MRI may be beneficial given the atypical features of what may be a large subserosal leiomyoma along the LEFT fundus to exclude presence of malignant transformation. 3. Small ventral hernia containing fluid density just below the xiphoid, of uncertain significance given the absence of ascites or the presence of morphologic changes of liver disease. Focused ultrasound of this area may be helpful. No soft tissue nodularity is visible on CT. 4. Small pulmonary nodule in the LEFT lung base 7 x 6 mm. Close attention on follow-up is suggested. This remains nonspecific but suspicious given other findings. 5. collecting systems of the RIGHT kidney are dilated with 2 ureters to at least the  level of the sacral promontory. Findings may reflect chronic reflux and mild obstruction. Correlate with any symptoms and consider follow-up renal sonogram to assess for any changes over time. 6. Aortic atherosclerosis. These results will be called to the ordering clinician or representative by the Radiologist Assistant, and communication documented in the PACS or [employer]. Aortic Atherosclerosis (ICD10-I70.0). Electronically Signed   By: Zetta Bills M.D.   On: 03/26/2020 17:55   CT ABDOMEN PELVIS W CONTRAST  Result Date: 03/26/2020 CLINICAL DATA:  Submucosal mass on EGD EXAM: CT CHEST, ABDOMEN, AND PELVIS WITH CONTRAST TECHNIQUE: Multidetector CT imaging of the chest, abdomen and pelvis was performed following the standard protocol during bolus administration of intravenous contrast. CONTRAST:  133mL OMNIPAQUE IOHEXOL 300 MG/ML  SOLN COMPARISON:  None FINDINGS: CT CHEST FINDINGS Cardiovascular: Calcified and noncalcified plaque throughout the thoracic aorta. No aneurysmal dilation. Central pulmonary vasculature on venous phase assessment is unremarkable. Heart size mildly enlarged with no pericardial effusion. Mediastinum/Nodes: Thoracic inlet structures are unremarkable. No axillary lymphadenopathy. No mediastinal adenopathy. No hilar adenopathy. Lungs/Pleura: Airways are patent. Mild atelectasis. Small pulmonary nodule in the LEFT lung base 7 x 6 mm (image 120, series 4) Musculoskeletal: No acute or destructive bone process relative to the bony thorax. See below for full musculoskeletal details. CT ABDOMEN PELVIS FINDINGS Hepatobiliary: No focal, suspicious hepatic lesion. The portal vein is patent. Mild biliary duct dilation to approximately 5-6 mm is nonspecific. No pericholecystic stranding or fluid. No gallbladder distension. Pancreas: Pancreas is normal without ductal dilation or inflammation. Spleen: Normal in size and contour. Adrenals/Urinary Tract: Adrenal glands are normal. Renal  enhancement is symmetric. 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 Two ureters are present to at least the level of the distal third of the RIGHT ureter. Stomach/Bowel: Lesion along the lesser curvature of the stomach with rounded, well-circumscribed margins and heterogeneous enhancement measuring 5.8 x 4.8 x 5.3 cm reportedly submucosal on endoscopic evaluation extending into the hepatic gastric ligament. Ulceration and or site of biopsy along the inferior margin. Small-bowel without acute process. Appendix is normal. Vascular/Lymphatic: Calcified atheromatous plaque in the abdominal aorta. No adenopathy in the retroperitoneum. No upper abdominal lymphadenopathy. Reproductive: Bulky uterus with multiple enhancing masses and with a calcified fibroid along the RIGHT uterus. One of these exophytic heterogeneous masses is dumbbell-shaped and extends towards the LEFT adnexa. Area is not well characterized on CT some of these measuring as large as 4.3 x 4.5 cm. Other: No ascites. There  is however a small ventral hernia containing fluid density just below the xiphoid, of uncertain significance given the absence of ascites or the presence of morphologic changes of liver disease. Musculoskeletal: Spinal degenerative changes. Dextroconvex curvature of the spine. L5-S1 anterolisthesis, grade 2 associated with degenerative changes. IMPRESSION: 1. 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 2. Bulky appearance of the uterus with heterogeneous masslike areas more likely fibroids particularly given the calcified appearance of the RIGHT uterus. One of these lesions displays a more lobular morphology and extends towards the LEFT adnexa. Leiomyomata are considered. In this 74 year old female would correlate with any changes in pelvic symptoms, bleeding or enlargement of the uterus noted on physical examination. Follow-up pelvic  MRI may be beneficial given the atypical features of what may be a large subserosal leiomyoma along the LEFT fundus to exclude presence of malignant transformation. 3. Small ventral hernia containing fluid density just below the xiphoid, of uncertain significance given the absence of ascites or the presence of morphologic changes of liver disease. Focused ultrasound of this area may be helpful. No soft tissue nodularity is visible on CT. 4. Small pulmonary nodule in the LEFT lung base 7 x 6 mm. Close attention on follow-up is suggested. This remains nonspecific but suspicious given other findings. 5. collecting systems of the RIGHT kidney are dilated with 2 ureters to at least the level of the sacral promontory. Findings may reflect chronic reflux and mild obstruction. Correlate with any symptoms and consider follow-up renal sonogram to assess for any changes over time. 6. Aortic atherosclerosis. These results will be called to the ordering clinician or representative by the Radiologist Assistant, and communication documented in the PACS or [employer]. Aortic Atherosclerosis (ICD10-I70.0). Electronically Signed   By: Zetta Bills M.D.   On: 03/26/2020 17:55    Review of Systems  Constitutional: Negative.   HENT: Negative.   Eyes: Negative.   Respiratory: Negative.   Cardiovascular: Negative.   Gastrointestinal: Positive for blood in stool. Negative for abdominal pain, nausea and vomiting.  Endocrine: Negative.   Genitourinary: Negative.   Musculoskeletal: Negative.   Skin: Negative.   Allergic/Immunologic: Negative.   Neurological: Negative.   Hematological: Negative.   Psychiatric/Behavioral: Negative.    Blood pressure 139/75, pulse (!) 59, temperature 98.7 F (37.1 C), temperature source Oral, resp. rate 16, height 5\' 2"  (1.575 m), weight 58.7 kg, SpO2 98 %. Physical Exam Constitutional:      General: She is not in acute distress.    Appearance: Normal appearance. She is normal  weight. She is not ill-appearing.  HENT:     Head: Normocephalic and atraumatic.     Right Ear: External ear normal.     Left Ear: External ear normal.     Nose: Nose normal.  Eyes:     General: No scleral icterus.    Extraocular Movements: Extraocular movements intact.     Conjunctiva/sclera: Conjunctivae normal.     Pupils: Pupils are equal, round, and reactive to light.  Cardiovascular:     Rate and Rhythm: Normal rate and regular rhythm.     Pulses: Normal pulses.     Comments: No pitting edema lower extr Pulmonary:     Effort: Pulmonary effort is normal. No respiratory distress.     Breath sounds: Normal breath sounds.  Abdominal:     General: Abdomen is flat. There is no distension.     Palpations: Abdomen is soft. There is no mass.     Tenderness: There is  no abdominal tenderness.  Musculoskeletal:        General: No tenderness or deformity. Normal range of motion.     Cervical back: Normal range of motion and neck supple. No tenderness.     Comments: Gait seems normal  Lymphadenopathy:     Cervical: No cervical adenopathy.     Comments: No groin or cervical lymphadenopathy  Skin:    General: Skin is warm and dry.     Findings: No erythema or rash.  Neurological:     General: No focal deficit present.     Mental Status: She is alert and oriented to person, place, and time.  Psychiatric:        Mood and Affect: Mood normal.        Behavior: Behavior normal.        Thought Content: Thought content normal.     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  We will review the scans with the radiologist but I suspect that she will need to have this mass excised.  I have discussed with her in detail some of the risks and benefits of the surgery as well as some of the technical aspects and she understands.  We will likely optimize her through the weekend and then plan for  definitive surgery early this week.  Autumn Messing III 03/27/2020, 9:29 AM

## 2020-03-28 LAB — BASIC METABOLIC PANEL
Anion gap: 7 (ref 5–15)
BUN: 14 mg/dL (ref 8–23)
CO2: 25 mmol/L (ref 22–32)
Calcium: 8.7 mg/dL — ABNORMAL LOW (ref 8.9–10.3)
Chloride: 107 mmol/L (ref 98–111)
Creatinine, Ser: 0.94 mg/dL (ref 0.44–1.00)
GFR calc Af Amer: >60 mL/min (ref >60)
GFR calc non Af Amer: 59 mL/min — ABNORMAL LOW (ref >60)
Glucose, Bld: 91 mg/dL (ref 70–99)
Potassium: 4 mmol/L (ref 3.5–5.1)
Sodium: 139 mmol/L (ref 135–145)

## 2020-03-28 LAB — GLUCOSE, CAPILLARY
Glucose-Capillary: 106 mg/dL — ABNORMAL HIGH (ref 70–99)
Glucose-Capillary: 92 mg/dL (ref 70–99)
Glucose-Capillary: 97 mg/dL (ref 70–99)

## 2020-03-28 LAB — CBC
HCT: 33.1 % — ABNORMAL LOW (ref 36.0–46.0)
Hemoglobin: 10.2 g/dL — ABNORMAL LOW (ref 12.0–15.0)
MCH: 26.8 pg (ref 26.0–34.0)
MCHC: 30.8 g/dL (ref 30.0–36.0)
MCV: 87.1 fL (ref 80.0–100.0)
Platelets: 213 10*3/uL (ref 150–400)
RBC: 3.8 MIL/uL — ABNORMAL LOW (ref 3.87–5.11)
RDW: 17.7 % — ABNORMAL HIGH (ref 11.5–15.5)
WBC: 6.9 10*3/uL (ref 4.0–10.5)
nRBC: 1.4 % — ABNORMAL HIGH (ref 0.0–0.2)

## 2020-03-28 MED ORDER — SODIUM CHLORIDE 0.9% IV SOLUTION: Freq: Once | INTRAVENOUS | Status: DC

## 2020-03-28 MED ORDER — SODIUM CHLORIDE 0.9 % IV SOLN
2.0000 g | INTRAVENOUS | Status: AC
  Administered 2020-03-29: 2 g via INTRAVENOUS
  Filled 2020-03-28: qty 2

## 2020-03-28 MED ORDER — GABAPENTIN 300 MG PO CAPS
300.0000 mg | ORAL_CAPSULE | ORAL | Status: AC
  Administered 2020-03-29: 300 mg via ORAL
  Filled 2020-03-28: qty 1

## 2020-03-28 MED ORDER — ACETAMINOPHEN 500 MG PO TABS
1000.0000 mg | ORAL_TABLET | ORAL | Status: AC
  Administered 2020-03-29: 1000 mg via ORAL
  Filled 2020-03-28: qty 2

## 2020-03-28 NOTE — Plan of Care (Signed)

## 2020-03-28 NOTE — Progress Notes (Signed)
2 Days Post-Op   Subjective/Chief Complaint: No complaints   Objective: Vital signs in last 24 hours: Temp:  [97.7 F (36.5 C)-98.6 F (37 C)] 98.6 F (37 C) (08/29 0552) Pulse Rate:  [58-69] 58 (08/29 0552) Resp:  [15-18] 16 (08/29 0552) BP: (113-150)/(65-86) 150/86 (08/29 0552) SpO2:  [96 %-100 %] 100 % (08/29 0552) Last BM Date: 03/27/20  Intake/Output from previous day: 08/28 0701 - 08/29 0700 In: 720 [P.O.:720] Out: -  Intake/Output this shift: No intake/output data recorded.  General appearance: alert and cooperative Resp: clear to auscultation bilaterally Cardio: regular rate and rhythm GI: soft, nontender  Lab Results:  Recent Labs    03/27/20 0336 03/28/20 0258  WBC 6.3 6.9  HGB 10.0* 10.2*  HCT 31.3* 33.1*  PLT 190 213   BMET Recent Labs    03/27/20 0336 03/28/20 0258  NA 142 139  K 3.2* 4.0  CL 107 107  CO2 26 25  GLUCOSE 99 91  BUN 6* 14  CREATININE 0.77 0.94  CALCIUM 8.8* 8.7*   PT/INR No results for input(s): LABPROT, INR in the last 72 hours. ABG No results for input(s): PHART, HCO3 in the last 72 hours.  Invalid input(s): PCO2, PO2  Studies/Results: CT CHEST W CONTRAST  Result Date: 03/26/2020 CLINICAL DATA:  Submucosal mass on EGD EXAM: CT CHEST, ABDOMEN, AND PELVIS WITH CONTRAST TECHNIQUE: Multidetector CT imaging of the chest, abdomen and pelvis was performed following the standard protocol during bolus administration of intravenous contrast. CONTRAST:  124mL OMNIPAQUE IOHEXOL 300 MG/ML  SOLN COMPARISON:  None FINDINGS: CT CHEST FINDINGS Cardiovascular: Calcified and noncalcified plaque throughout the thoracic aorta. No aneurysmal dilation. Central pulmonary vasculature on venous phase assessment is unremarkable. Heart size mildly enlarged with no pericardial effusion. Mediastinum/Nodes: Thoracic inlet structures are unremarkable. No axillary lymphadenopathy. No mediastinal adenopathy. No hilar adenopathy. Lungs/Pleura: Airways are  patent. Mild atelectasis. Small pulmonary nodule in the LEFT lung base 7 x 6 mm (image 120, series 4) Musculoskeletal: No acute or destructive bone process relative to the bony thorax. See below for full musculoskeletal details. CT ABDOMEN PELVIS FINDINGS Hepatobiliary: No focal, suspicious hepatic lesion. The portal vein is patent. Mild biliary duct dilation to approximately 5-6 mm is nonspecific. No pericholecystic stranding or fluid. No gallbladder distension. Pancreas: Pancreas is normal without ductal dilation or inflammation. Spleen: Normal in size and contour. Adrenals/Urinary Tract: Adrenal glands are normal. Renal enhancement is symmetric. 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 Two ureters are present to at least the level of the distal third of the RIGHT ureter. Stomach/Bowel: Lesion along the lesser curvature of the stomach with rounded, well-circumscribed margins and heterogeneous enhancement measuring 5.8 x 4.8 x 5.3 cm reportedly submucosal on endoscopic evaluation extending into the hepatic gastric ligament. Ulceration and or site of biopsy along the inferior margin. Small-bowel without acute process. Appendix is normal. Vascular/Lymphatic: Calcified atheromatous plaque in the abdominal aorta. No adenopathy in the retroperitoneum. No upper abdominal lymphadenopathy. Reproductive: Bulky uterus with multiple enhancing masses and with a calcified fibroid along the RIGHT uterus. One of these exophytic heterogeneous masses is dumbbell-shaped and extends towards the LEFT adnexa. Area is not well characterized on CT some of these measuring as large as 4.3 x 4.5 cm. Other: No ascites. There is however a small ventral hernia containing fluid density just below the xiphoid, of uncertain significance given the absence of ascites or the  presence of morphologic changes of liver disease. Musculoskeletal: Spinal degenerative  changes. Dextroconvex curvature of the spine. L5-S1 anterolisthesis, grade 2 associated with degenerative changes. IMPRESSION: 1. 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 2. Bulky appearance of the uterus with heterogeneous masslike areas more likely fibroids particularly given the calcified appearance of the RIGHT uterus. One of these lesions displays a more lobular morphology and extends towards the LEFT adnexa. Leiomyomata are considered. In this 74 year old female would correlate with any changes in pelvic symptoms, bleeding or enlargement of the uterus noted on physical examination. Follow-up pelvic MRI may be beneficial given the atypical features of what may be a large subserosal leiomyoma along the LEFT fundus to exclude presence of malignant transformation. 3. Small ventral hernia containing fluid density just below the xiphoid, of uncertain significance given the absence of ascites or the presence of morphologic changes of liver disease. Focused ultrasound of this area may be helpful. No soft tissue nodularity is visible on CT. 4. Small pulmonary nodule in the LEFT lung base 7 x 6 mm. Close attention on follow-up is suggested. This remains nonspecific but suspicious given other findings. 5. collecting systems of the RIGHT kidney are dilated with 2 ureters to at least the level of the sacral promontory. Findings may reflect chronic reflux and mild obstruction. Correlate with any symptoms and consider follow-up renal sonogram to assess for any changes over time. 6. Aortic atherosclerosis. These results will be called to the ordering clinician or representative by the Radiologist Assistant, and communication documented in the PACS or [employer]. Aortic Atherosclerosis (ICD10-I70.0). Electronically Signed   By: Zetta Bills M.D.   On: 03/26/2020 17:55   CT ABDOMEN PELVIS W CONTRAST  Result Date: 03/26/2020 CLINICAL DATA:  Submucosal mass on EGD EXAM: CT CHEST, ABDOMEN, AND PELVIS  WITH CONTRAST TECHNIQUE: Multidetector CT imaging of the chest, abdomen and pelvis was performed following the standard protocol during bolus administration of intravenous contrast. CONTRAST:  116mL OMNIPAQUE IOHEXOL 300 MG/ML  SOLN COMPARISON:  None FINDINGS: CT CHEST FINDINGS Cardiovascular: Calcified and noncalcified plaque throughout the thoracic aorta. No aneurysmal dilation. Central pulmonary vasculature on venous phase assessment is unremarkable. Heart size mildly enlarged with no pericardial effusion. Mediastinum/Nodes: Thoracic inlet structures are unremarkable. No axillary lymphadenopathy. No mediastinal adenopathy. No hilar adenopathy. Lungs/Pleura: Airways are patent. Mild atelectasis. Small pulmonary nodule in the LEFT lung base 7 x 6 mm (image 120, series 4) Musculoskeletal: No acute or destructive bone process relative to the bony thorax. See below for full musculoskeletal details. CT ABDOMEN PELVIS FINDINGS Hepatobiliary: No focal, suspicious hepatic lesion. The portal vein is patent. Mild biliary duct dilation to approximately 5-6 mm is nonspecific. No pericholecystic stranding or fluid. No gallbladder distension. Pancreas: Pancreas is normal without ductal dilation or inflammation. Spleen: Normal in size and contour. Adrenals/Urinary Tract: Adrenal glands are normal. Renal enhancement is symmetric. 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 Two ureters are present to at least the level of the distal third of the RIGHT ureter. Stomach/Bowel: Lesion along the lesser curvature of the stomach with rounded, well-circumscribed margins and heterogeneous enhancement measuring 5.8 x 4.8 x 5.3 cm reportedly submucosal on endoscopic evaluation extending into the hepatic gastric ligament. Ulceration and or site of biopsy along the inferior margin. Small-bowel without acute process. Appendix is normal. Vascular/Lymphatic:  Calcified atheromatous plaque in the abdominal aorta. No adenopathy in the retroperitoneum. No  upper abdominal lymphadenopathy. Reproductive: Bulky uterus with multiple enhancing masses and with a calcified fibroid along the RIGHT uterus. One of these exophytic heterogeneous masses is dumbbell-shaped and extends towards the LEFT adnexa. Area is not well characterized on CT some of these measuring as large as 4.3 x 4.5 cm. Other: No ascites. There is however a small ventral hernia containing fluid density just below the xiphoid, of uncertain significance given the absence of ascites or the presence of morphologic changes of liver disease. Musculoskeletal: Spinal degenerative changes. Dextroconvex curvature of the spine. L5-S1 anterolisthesis, grade 2 associated with degenerative changes. IMPRESSION: 1. 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 2. Bulky appearance of the uterus with heterogeneous masslike areas more likely fibroids particularly given the calcified appearance of the RIGHT uterus. One of these lesions displays a more lobular morphology and extends towards the LEFT adnexa. Leiomyomata are considered. In this 74 year old female would correlate with any changes in pelvic symptoms, bleeding or enlargement of the uterus noted on physical examination. Follow-up pelvic MRI may be beneficial given the atypical features of what may be a large subserosal leiomyoma along the LEFT fundus to exclude presence of malignant transformation. 3. Small ventral hernia containing fluid density just below the xiphoid, of uncertain significance given the absence of ascites or the presence of morphologic changes of liver disease. Focused ultrasound of this area may be helpful. No soft tissue nodularity is visible on CT. 4. Small pulmonary nodule in the LEFT lung base 7 x 6 mm. Close attention on follow-up is suggested. This remains nonspecific but suspicious given other findings. 5. collecting systems of the  RIGHT kidney are dilated with 2 ureters to at least the level of the sacral promontory. Findings may reflect chronic reflux and mild obstruction. Correlate with any symptoms and consider follow-up renal sonogram to assess for any changes over time. 6. Aortic atherosclerosis. These results will be called to the ordering clinician or representative by the Radiologist Assistant, and communication documented in the PACS or [employer]. Aortic Atherosclerosis (ICD10-I70.0). Electronically Signed   By: Zetta Bills M.D.   On: 03/26/2020 17:55    Anti-infectives: Anti-infectives (From admission, onward)   None      Assessment/Plan: s/p Procedure(s): BIOPSY ESOPHAGOGASTRODUODENOSCOPY (EGD) WITH PROPOFOL (N/A) Advance diet. Stay on liquids today Will likely need partial gastrectomy early this week for possible GIST tumor.  Hg stable  LOS: 2 days    Autumn Messing III 03/28/2020

## 2020-03-28 NOTE — Plan of Care (Signed)
  Problem: Coping: Goal: Level of anxiety will decrease Outcome: Progressing   Problem: Nutrition: Goal: Adequate nutrition will be maintained Outcome: Progressing   Problem: Activity: Goal: Risk for activity intolerance will decrease Outcome: Progressing   Problem: Clinical Measurements: Goal: Will remain free from infection Outcome: Progressing

## 2020-03-28 NOTE — Progress Notes (Signed)
[facility]                                                                              Patient Demographics  Lori Rush, is a 74 y.o. female, DOB - 01-Sep-1945, WPY:[id number]  Admit date - 03/26/2020   Admitting Physician Jaedyn Lard Krystal Eaton, MD  Outpatient Primary MD for the patient is Glendon Axe, MD  Outpatient specialists:   LOS - 2  days   Medical records reviewed and are as summarized below:    Chief Complaint  Patient presents with  . abnormal labs       Brief summary   Patient is a 74 year old female with history of hypertension, CKD stage II, iron deficiency anemia, was admitted for symptomatic anemia in 12/2019.  At that time patient had undergone capsule endoscopy which did not show any reason for anemia.  Patient had endoscopy on 8/26 which showed normal esophagus, small hiatal hernia, abnormal lesion 2 cm, submucosal, no active bleeding, fresh blood in the duodenum, was irrigated but no etiology was found.  Patient was seen at PCPs office on 8/26 and was informed that her hemoglobin was very low and recommended ER evaluation and blood transfusion.  Patient also reported she had melanotic stools for last several weeks otherwise no abdominal pain.  She also reported increasing fatigue, generalized weakness, no chest pain or shortness of breath.   Assessment & Plan    Principal Problem:   Symptomatic anemia in the setting of upper GI bleed, chronic blood loss anemia, iron deficiency anemia -EGD outpatient showed submucosal abnormal lesion 2 cm, fresh blood in duodenum. 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   -Continue liquid diet   Active Problems:   Hypertension -1 reading elevated otherwise stable, continue Norvasc, metoprolol   Acute on CKD (chronic kidney disease) stage 2, GFR 60-89 ml/min -Creatinine 1.4 at the time of admission likely due to severe anemia -Creatinine stable, 0.9  Hypokalemia -Resolved  Code Status: Full CODE STATUS DVT Prophylaxis:   SCD's Family Communication: Discussed all imaging results, lab results, explained to the patient.   Disposition Plan:     Status is: Inpatient  The patient will require care spanning > 2 midnights and should be moved to inpatient because: Inpatient level of care appropriate due to severity of illness  Dispo: The patient is from: Home              Anticipated d/c is to: Home              Anticipated d/c date is: 1 day              Patient currently is not medically stable to d/c.  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 03/28/20 1048 03/30/20 0559         Medications  Scheduled Meds: . sodium chloride   Intravenous Once  . [START ON 03/29/2020] acetaminophen  1,000 mg Oral On Call to OR  . amLODipine  5 mg Oral Daily  . vitamin C  250 mg Oral Daily  . feeding supplement (ENSURE ENLIVE)  237 mL Oral BID BM  . fluticasone  1 spray Each Nare Daily  . [START ON 03/29/2020] gabapentin  300 mg Oral On Call to OR  . metoprolol tartrate  100 mg Oral BID  . multivitamin with minerals  1 tablet Oral Daily  . pantoprazole  40 mg Oral BID AC   Continuous Infusions: . sodium chloride Stopped (03/26/20 1639)   . [START ON 03/29/2020] cefoTEtan (CEFOTAN) IV     PRN Meds:.sodium chloride, diphenhydrAMINE, hydrALAZINE, ondansetron **OR** ondansetron (ZOFRAN) IV      Subjective:   Lori Rush was seen and examined today.  Intermittent black stool, H&H stable.  No nausea or vomiting.  No abdominal pain.  Tolerating diet without any difficulty, awaiting surgery. Patient denies dizziness, chest pain, shortness of breath, new weakness, numbess, tingling.  No fevers Objective:   Vitals:   03/27/20 0523 03/27/20 1403 03/27/20 2157 03/28/20 0552  BP: 139/75 117/74 113/65 (!) 150/86  Pulse: (!) 59 (!) 58 69 (!) 58  Resp: 16 18 15 16   Temp: 98.7 F (37.1 C) 97.7 F (36.5 C) 98.4 F (36.9 C) 98.6 F (37 C)  TempSrc: Oral Oral Oral Oral  SpO2: 98% 100% 96% 100%  Weight:      Height:        Intake/Output Summary (Last 24 hours) at 03/28/2020 1231 Last data filed at 03/28/2020 1000 Gross per 24 hour  Intake 480 ml  Output --  Net 480 ml     Wt Readings from Last 3 Encounters:  03/26/20 58.7 kg  01/23/20 60.7 kg  10/16/19 62.3 kg   Physical Exam  General: Alert and oriented x 3, NAD  Cardiovascular: S1 S2 clear, RRR. No pedal edema b/l  Respiratory: CTAB, no wheezing, rales or rhonchi  Gastrointestinal: Soft, nontender, nondistended, NBS  Ext: no pedal edema bilaterally  Neuro: no new deficits  Musculoskeletal: No cyanosis, clubbing  Skin: No rashes  Psych: Normal affect and demeanor, alert and oriented x3     Data Reviewed:  I have personally reviewed following labs and imaging studies  Micro Results Recent Results (from the past 240 hour(s))  SARS Coronavirus 2 by RT PCR (hospital order, performed in [facility] hospital lab) Nasopharyngeal Nasopharyngeal Swab     Status: None   Collection Time: 03/26/20  2:30 AM   Specimen: Nasopharyngeal Swab  Result Value Ref Range Status   SARS Coronavirus 2 NEGATIVE NEGATIVE Final    Comment: (NOTE) SARS-CoV-2 target nucleic  acids are NOT DETECTED.  The SARS-CoV-2 RNA is generally detectable in upper and lower respiratory specimens during the acute phase of infection. The lowest concentration of SARS-CoV-2 viral copies this assay can detect is 250 copies / mL. A negative result  does not preclude SARS-CoV-2 infection and should not be used as the sole basis for treatment or other patient management decisions.  A negative result may occur with improper specimen collection / handling, submission of specimen other than nasopharyngeal swab, presence of viral mutation(s) within the areas targeted by this assay, and inadequate number of viral copies (<250 copies / mL). A negative result must be combined with clinical observations, patient history, and epidemiological information.  Fact Sheet for Patients:   StrictlyIdeas.no  Fact Sheet for Healthcare Providers: BankingDealers.co.za  This test is not yet approved or  cleared by the [place] FDA and has been authorized for detection and/or diagnosis of SARS-CoV-2 by FDA under an Emergency Use Authorization (EUA).  This EUA will remain in effect (meaning this test can be used) for the duration of the COVID-19 declaration under Section 564(b)(1) of the Act, 21 U.S.C. section 360bbb-3(b)(1), unless the authorization is terminated or revoked sooner.  Performed at [facility], [place] [address], [place], [place] [address]     Radiology Reports CT CHEST W CONTRAST  Result Date: 03/26/2020 CLINICAL DATA:  Submucosal mass on EGD EXAM: CT CHEST, ABDOMEN, AND PELVIS WITH CONTRAST TECHNIQUE: Multidetector CT imaging of the chest, abdomen and pelvis was performed following the standard protocol during bolus administration of intravenous contrast. CONTRAST:  166mL OMNIPAQUE IOHEXOL 300 MG/ML  SOLN COMPARISON:  None FINDINGS: CT CHEST FINDINGS Cardiovascular: Calcified and noncalcified plaque throughout the  thoracic aorta. No aneurysmal dilation. Central pulmonary vasculature on venous phase assessment is unremarkable. Heart size mildly enlarged with no pericardial effusion. Mediastinum/Nodes: Thoracic inlet structures are unremarkable. No axillary lymphadenopathy. No mediastinal adenopathy. No hilar adenopathy. Lungs/Pleura: Airways are patent. Mild atelectasis. Small pulmonary nodule in the LEFT lung base 7 x 6 mm (image 120, series 4) Musculoskeletal: No acute or destructive bone process relative to the bony thorax. See below for full musculoskeletal details. CT ABDOMEN PELVIS FINDINGS Hepatobiliary: No focal, suspicious hepatic lesion. The portal vein is patent. Mild biliary duct dilation to approximately 5-6 mm is nonspecific. No pericholecystic stranding or fluid. No gallbladder distension. Pancreas: Pancreas is normal without ductal dilation or inflammation. Spleen: Normal in size and contour. Adrenals/Urinary Tract: Adrenal glands are normal. Renal enhancement is symmetric. 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 Two ureters are present to at least the level of the distal third of the RIGHT ureter. Stomach/Bowel: Lesion along the lesser curvature of the stomach with rounded, well-circumscribed margins and heterogeneous enhancement measuring 5.8 x 4.8 x 5.3 cm reportedly submucosal on endoscopic evaluation extending into the hepatic gastric ligament. Ulceration and or site of biopsy along the inferior margin. Small-bowel without acute process. Appendix is normal. Vascular/Lymphatic: Calcified atheromatous plaque in the abdominal aorta. No adenopathy in the retroperitoneum. No upper abdominal lymphadenopathy. Reproductive: Bulky uterus with multiple enhancing masses and with a calcified fibroid along the RIGHT uterus. One of these exophytic heterogeneous masses is dumbbell-shaped and extends towards the LEFT adnexa. Area  is not well characterized on CT some of these measuring as large as 4.3 x 4.5 cm. Other: No ascites. There is however a small ventral hernia containing fluid density just below the xiphoid, of uncertain significance given the absence of ascites or the presence of morphologic changes of liver disease. Musculoskeletal: Spinal degenerative changes. Dextroconvex curvature of the spine. L5-S1 anterolisthesis, grade 2 associated with degenerative changes. IMPRESSION: 1. 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 2. Bulky appearance of the uterus with heterogeneous masslike areas more likely fibroids particularly given the calcified appearance of the RIGHT uterus. One of these lesions displays a more lobular morphology and extends towards the LEFT adnexa. Leiomyomata are considered. In this 74 year old female would correlate with any changes in pelvic symptoms, bleeding or enlargement of the uterus noted on physical examination. Follow-up pelvic MRI may be beneficial given the atypical features of what may be a large subserosal leiomyoma along the LEFT fundus to exclude presence of malignant transformation. 3. Small ventral hernia containing fluid density just below the xiphoid, of uncertain significance given the absence of ascites or the presence of morphologic changes of liver disease. Focused ultrasound of this area may be helpful. No soft tissue nodularity is visible on CT. 4. Small pulmonary nodule in the LEFT lung base 7 x 6 mm. Close attention on follow-up is suggested. This remains nonspecific but suspicious given other findings. 5. collecting systems of the RIGHT kidney are dilated with 2 ureters to at least the level of the sacral promontory. Findings may reflect chronic reflux and mild obstruction. Correlate with any symptoms and consider follow-up renal sonogram to assess for any changes over time. 6. Aortic atherosclerosis. These results will be called to the ordering clinician or  representative by the Radiologist Assistant, and communication documented in the PACS or [employer]. Aortic Atherosclerosis (ICD10-I70.0). Electronically Signed   By: Zetta Bills M.D.   On: 03/26/2020 17:55   CT ABDOMEN PELVIS W CONTRAST  Result Date: 03/26/2020 CLINICAL DATA:  Submucosal mass on EGD EXAM: CT CHEST, ABDOMEN, AND PELVIS WITH CONTRAST TECHNIQUE: Multidetector CT imaging of the chest, abdomen and pelvis was performed following the standard protocol during bolus administration of intravenous contrast. CONTRAST:  151mL OMNIPAQUE IOHEXOL 300 MG/ML  SOLN COMPARISON:  None FINDINGS: CT CHEST FINDINGS Cardiovascular: Calcified and noncalcified plaque throughout the thoracic aorta. No aneurysmal dilation. Central pulmonary vasculature on venous phase assessment is unremarkable. Heart size mildly enlarged with no pericardial effusion. Mediastinum/Nodes: Thoracic inlet structures are unremarkable. No axillary lymphadenopathy. No mediastinal adenopathy. No hilar adenopathy. Lungs/Pleura: Airways are patent. Mild atelectasis. Small pulmonary nodule in the LEFT lung base 7 x 6 mm (image 120, series 4) Musculoskeletal: No acute or destructive bone process relative to the bony thorax. See below for full musculoskeletal details. CT ABDOMEN PELVIS FINDINGS Hepatobiliary: No focal, suspicious hepatic lesion. The portal vein is patent. Mild biliary duct dilation to approximately 5-6 mm is nonspecific. No pericholecystic stranding or fluid. No gallbladder distension. Pancreas: Pancreas is normal without ductal dilation or inflammation. Spleen: Normal in size and contour. Adrenals/Urinary Tract: Adrenal glands are normal. Renal enhancement is symmetric. 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 Two ureters are present to at least the level of the distal third of the RIGHT ureter. Stomach/Bowel: Lesion along the lesser  curvature of the stomach with rounded, well-circumscribed margins and heterogeneous enhancement measuring 5.8 x 4.8 x 5.3 cm reportedly submucosal on endoscopic evaluation extending into the hepatic gastric ligament. Ulceration and or site of biopsy along the inferior margin. Small-bowel without acute process. Appendix is normal. Vascular/Lymphatic: Calcified atheromatous plaque in the abdominal aorta. No adenopathy in the retroperitoneum. No upper abdominal lymphadenopathy. Reproductive: Bulky uterus with multiple enhancing masses and with a calcified fibroid along the RIGHT uterus. One of these exophytic heterogeneous masses is dumbbell-shaped and extends towards the  LEFT adnexa. Area is not well characterized on CT some of these measuring as large as 4.3 x 4.5 cm. Other: No ascites. There is however a small ventral hernia containing fluid density just below the xiphoid, of uncertain significance given the absence of ascites or the presence of morphologic changes of liver disease. Musculoskeletal: Spinal degenerative changes. Dextroconvex curvature of the spine. L5-S1 anterolisthesis, grade 2 associated with degenerative changes. IMPRESSION: 1. 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 2. Bulky appearance of the uterus with heterogeneous masslike areas more likely fibroids particularly given the calcified appearance of the RIGHT uterus. One of these lesions displays a more lobular morphology and extends towards the LEFT adnexa. Leiomyomata are considered. In this 74 year old female would correlate with any changes in pelvic symptoms, bleeding or enlargement of the uterus noted on physical examination. Follow-up pelvic MRI may be beneficial given the atypical features of what may be a large subserosal leiomyoma along the LEFT fundus to exclude presence of malignant transformation. 3. Small ventral hernia containing fluid density just below the xiphoid, of uncertain significance given the absence of  ascites or the presence of morphologic changes of liver disease. Focused ultrasound of this area may be helpful. No soft tissue nodularity is visible on CT. 4. Small pulmonary nodule in the LEFT lung base 7 x 6 mm. Close attention on follow-up is suggested. This remains nonspecific but suspicious given other findings. 5. collecting systems of the RIGHT kidney are dilated with 2 ureters to at least the level of the sacral promontory. Findings may reflect chronic reflux and mild obstruction. Correlate with any symptoms and consider follow-up renal sonogram to assess for any changes over time. 6. Aortic atherosclerosis. These results will be called to the ordering clinician or representative by the Radiologist Assistant, and communication documented in the PACS or [employer]. Aortic Atherosclerosis (ICD10-I70.0). Electronically Signed   By: Zetta Bills M.D.   On: 03/26/2020 17:55    Lab Data:  CBC: Recent Labs  Lab 03/25/20 1938 03/26/20 0649 03/26/20 1719 03/27/20 0336 03/28/20 0258  WBC 6.1 4.1  --  6.3 6.9  HGB 7.0* 6.1* 11.2* 10.0* 10.2*  HCT 23.4* 20.8* 36.2 31.3* 33.1*  MCV 85.4 86.0  --  84.1 87.1  PLT 224 193  --  190 865   Basic Metabolic Panel: Recent Labs  Lab 03/25/20 1938 03/26/20 0649 03/27/20 0336 03/28/20 0258  NA 139 141 142 139  K 3.1* 3.0* 3.2* 4.0  CL 106 109 107 107  CO2 25 25 26 25   GLUCOSE 140* 121* 99 91  BUN 16 12 6* 14  CREATININE 1.45* 0.93 0.77 0.94  CALCIUM 8.7* 8.2* 8.8* 8.7*   GFR: Estimated Creatinine Clearance: 40.3 mL/min (by C-G formula based on SCr of 0.94 mg/dL). Liver Function Tests: Recent Labs  Lab 03/25/20 1938  AST 23  ALT 16  ALKPHOS 59  BILITOT 0.5  PROT 6.9  ALBUMIN 3.5   No results for input(s): LIPASE, AMYLASE in the last 168 hours. No results for input(s): AMMONIA in the last 168 hours. Coagulation Profile: No results for input(s): INR, PROTIME in the last 168 hours. Cardiac Enzymes: No results for input(s):  CKTOTAL, CKMB, CKMBINDEX, TROPONINI in the last 168 hours. BNP (last 3 results) No results for input(s): PROBNP in the last 8760 hours. HbA1C: No results for input(s): HGBA1C in the last 72 hours. CBG: Recent Labs  Lab 03/26/20 2348 03/27/20 0738 03/27/20 1543 03/27/20 2353 03/28/20 0719  GLUCAP 90 85  100* 87 92   Lipid Profile: No results for input(s): CHOL, HDL, LDLCALC, TRIG, CHOLHDL, LDLDIRECT in the last 72 hours. Thyroid Function Tests: No results for input(s): TSH, T4TOTAL, FREET4, T3FREE, THYROIDAB in the last 72 hours. Anemia Panel: No results for input(s): VITAMINB12, FOLATE, FERRITIN, TIBC, IRON, RETICCTPCT in the last 72 hours. Urine analysis:    Component Value Date/Time   COLORURINE STRAW (A) 01/22/2020 2130   APPEARANCEUR CLEAR 01/22/2020 2130   LABSPEC 1.009 01/22/2020 2130   PHURINE 6.0 01/22/2020 2130   GLUCOSEU NEGATIVE 01/22/2020 2130   HGBUR NEGATIVE 01/22/2020 2130   BILIRUBINUR NEGATIVE 01/22/2020 2130   KETONESUR NEGATIVE 01/22/2020 2130   PROTEINUR NEGATIVE 01/22/2020 2130   NITRITE NEGATIVE 01/22/2020 2130   LEUKOCYTESUR NEGATIVE 01/22/2020 2130     Jansen Goodpasture M.D. [facility] 03/28/2020, 12:31 PM   Call night coverage person covering after 7pm

## 2020-03-28 NOTE — Progress Notes (Signed)
[facility] Progress Note  Chief Complaint: Melena  History:  Intermittent small liquid black stool.  Hemoglobin remained stable since I saw her yesterday.  Tolerating regular diet without difficulty.  Surgery is planning resection of this gastric tumor early this week.  ROS: Denies chest pain dyspnea or dysuria  Objective:   Current Facility-Administered Medications:  .  0.9 %  sodium chloride infusion (Manually program via Guardrails IV Fluids), , Intravenous, Once, Autumn Messing III, MD .  0.9 %  sodium chloride infusion, , Intravenous, PRN, Rai, Vernelle Emerald, MD, Stopped at 03/26/20 1639 .  amLODipine (NORVASC) tablet 5 mg, 5 mg, Oral, Daily, Danis, Estill Cotta III, MD, 5 mg at 03/28/20 0819 .  ascorbic acid (VITAMIN C) tablet 250 mg, 250 mg, Oral, Daily, Danis, Estill Cotta III, MD, 250 mg at 03/28/20 0818 .  diphenhydrAMINE (BENADRYL) capsule 25 mg, 25 mg, Oral, Q6H PRN, Eudelia Bunch, RPH .  feeding supplement (ENSURE ENLIVE) (ENSURE ENLIVE) liquid 237 mL, 237 mL, Oral, BID BM, Rai, Ripudeep K, MD, 237 mL at 03/28/20 0819 .  fluticasone (FLONASE) 50 MCG/ACT nasal spray 1 spray, 1 spray, Each Nare, Daily, Danis, Estill Cotta III, MD, 1 spray at 03/28/20 0819 .  hydrALAZINE (APRESOLINE) injection 10 mg, 10 mg, Intravenous, Q4H PRN, Nelida Meuse III, MD, 10 mg at 03/26/20 1605 .  metoprolol tartrate (LOPRESSOR) tablet 100 mg, 100 mg, Oral, BID, Nelida Meuse III, MD, 100 mg at 03/28/20 0818 .  multivitamin with minerals tablet 1 tablet, 1 tablet, Oral, Daily, Rai, Ripudeep K, MD, 1 tablet at 03/28/20 0818 .  ondansetron (ZOFRAN) tablet 4 mg, 4 mg, Oral, Q6H PRN **OR** ondansetron (ZOFRAN) injection 4 mg, 4 mg, Intravenous, Q6H PRN, Danis, Ridley Dileo L III, MD .  pantoprazole (PROTONIX) EC tablet 40 mg, 40 mg, Oral, BID AC, Nelida Meuse III, MD, 40 mg at 03/28/20 0819  . sodium chloride Stopped (03/26/20 1639)     Vital signs in last 24 hrs: Vitals:   03/27/20 2157 03/28/20 0552  BP: 113/65 (!)  150/86  Pulse: 69 (!) 58  Resp: 15 16  Temp: 98.4 F (36.9 C) 98.6 F (37 C)  SpO2: 96% 100%    Intake/Output Summary (Last 24 hours) at 03/28/2020 0957 Last data filed at 03/28/2020 6295 Gross per 24 hour  Intake 840 ml  Output --  Net 840 ml     Physical Exam Alert, conversational, well-appearing  Cardiac: RRR without murmurs, S1S2 heard, no peripheral edema  Pulm: clear to auscultation bilaterally, normal RR and effort noted  Abdomen: soft, no tenderness, with active bowel sounds. No guarding or palpable hepatosplenomegaly  Skin; warm and dry, no jaundice  Recent Labs:  CBC Latest Ref Rng & Units 03/28/2020 03/27/2020 03/26/2020  WBC 4.0 - 10.5 K/uL 6.9 6.3 -  Hemoglobin 12.0 - 15.0 g/dL 10.2(L) 10.0(L) 11.2(L)  Hematocrit 36 - 46 % 33.1(L) 31.3(L) 36.2  Platelets 150 - 400 K/uL 213 190 -    No results for input(s): INR in the last 168 hours. CMP Latest Ref Rng & Units 03/28/2020 03/27/2020 03/26/2020  Glucose 70 - 99 mg/dL 91 99 121(H)  BUN 8 - 23 mg/dL 14 6(L) 12  Creatinine 0.44 - 1.00 mg/dL 0.94 0.77 0.93  Sodium 135 - 145 mmol/L 139 142 141  Potassium 3.5 - 5.1 mmol/L 4.0 3.2(L) 3.0(L)  Chloride 98 - 111 mmol/L 107 107 109  CO2 22 - 32 mmol/L 25 26 25   Calcium 8.9 - 10.3  mg/dL 8.7(L) 8.8(L) 8.2(L)  Total Protein 6.5 - 8.1 g/dL - - -  Total Bilirubin 0.3 - 1.2 mg/dL - - -  Alkaline Phos 38 - 126 U/L - - -  AST 15 - 41 U/L - - -  ALT 0 - 44 U/L - - -    Assessment & Plan  Assessment: Melena Acute on chronic blood loss anemia. Gastric mass with central ulceration that is the source of bleeding.  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  Plan: Surgical resection, timing per surgical service.  Needs to remain inpatient for that. Oral acid suppression Regular diet  Our service will sign off and be available if needed.  Thank you for involving [place] in her care.  Nelida Meuse III Office: [phone]

## 2020-03-29 ENCOUNTER — Inpatient Hospital Stay (HOSPITAL_COMMUNITY): Payer: Medicaid Other

## 2020-03-29 ENCOUNTER — Inpatient Hospital Stay (HOSPITAL_COMMUNITY): Payer: Medicaid Other | Admitting: Certified Registered"

## 2020-03-29 ENCOUNTER — Encounter (HOSPITAL_COMMUNITY)
Admission: EM | Disposition: A | Discharge status: Home / Self Care | Payer: Self-pay | Source: Ambulatory Visit | Attending: Internal Medicine

## 2020-03-29 ENCOUNTER — Encounter (HOSPITAL_COMMUNITY): Payer: Self-pay | Admitting: Internal Medicine

## 2020-03-29 ENCOUNTER — Telehealth: Payer: Self-pay

## 2020-03-29 ENCOUNTER — Other Ambulatory Visit: Payer: Self-pay

## 2020-03-29 HISTORY — PX: LAPAROSCOPIC GASTRECTOMY: SHX5894

## 2020-03-29 LAB — CBC
HCT: 32.4 % — ABNORMAL LOW (ref 36.0–46.0)
Hemoglobin: 10 g/dL — ABNORMAL LOW (ref 12.0–15.0)
MCH: 26.7 pg (ref 26.0–34.0)
MCHC: 30.9 g/dL (ref 30.0–36.0)
MCV: 86.6 fL (ref 80.0–100.0)
Platelets: 222 10*3/uL (ref 150–400)
RBC: 3.74 MIL/uL — ABNORMAL LOW (ref 3.87–5.11)
RDW: 17.4 % — ABNORMAL HIGH (ref 11.5–15.5)
WBC: 6.4 10*3/uL (ref 4.0–10.5)
nRBC: 0 % (ref 0.0–0.2)

## 2020-03-29 LAB — GLUCOSE, CAPILLARY
Glucose-Capillary: 101 mg/dL — ABNORMAL HIGH (ref 70–99)
Glucose-Capillary: 125 mg/dL — ABNORMAL HIGH (ref 70–99)
Glucose-Capillary: 132 mg/dL — ABNORMAL HIGH (ref 70–99)

## 2020-03-29 LAB — BASIC METABOLIC PANEL
Anion gap: 10 (ref 5–15)
BUN: 19 mg/dL (ref 8–23)
CO2: 25 mmol/L (ref 22–32)
Calcium: 8.5 mg/dL — ABNORMAL LOW (ref 8.9–10.3)
Chloride: 102 mmol/L (ref 98–111)
Creatinine, Ser: 1.02 mg/dL — ABNORMAL HIGH (ref 0.44–1.00)
GFR calc Af Amer: >60 mL/min (ref >60)
GFR calc non Af Amer: 53 mL/min — ABNORMAL LOW (ref >60)
Glucose, Bld: 106 mg/dL — ABNORMAL HIGH (ref 70–99)
Potassium: 3.3 mmol/L — ABNORMAL LOW (ref 3.5–5.1)
Sodium: 137 mmol/L (ref 135–145)

## 2020-03-29 LAB — SURGICAL PCR SCREEN
MRSA, PCR: NEGATIVE
Staphylococcus aureus: NEGATIVE

## 2020-03-29 LAB — SURGICAL PATHOLOGY

## 2020-03-29 SURGERY — GASTRECTOMY, LAPAROSCOPIC
Anesthesia: General | Site: Abdomen

## 2020-03-29 MED ORDER — SUGAMMADEX SODIUM 200 MG/2ML IV SOLN
INTRAVENOUS | Status: DC | PRN
  Administered 2020-03-29: 120 mg via INTRAVENOUS

## 2020-03-29 MED ORDER — PROPOFOL 10 MG/ML IV BOLUS
INTRAVENOUS | Status: AC
  Filled 2020-03-29: qty 20

## 2020-03-29 MED ORDER — MORPHINE SULFATE (PF) 2 MG/ML IV SOLN: 2.0000 mg | INTRAVENOUS | Status: DC | PRN

## 2020-03-29 MED ORDER — PHENYLEPHRINE 40 MCG/ML (10ML) SYRINGE FOR IV PUSH (FOR BLOOD PRESSURE SUPPORT)
PREFILLED_SYRINGE | INTRAVENOUS | Status: DC | PRN
  Administered 2020-03-29: 60 ug via INTRAVENOUS
  Administered 2020-03-29 (×2): 40 ug via INTRAVENOUS
  Administered 2020-03-29: 80 ug via INTRAVENOUS

## 2020-03-29 MED ORDER — ROCURONIUM BROMIDE 10 MG/ML (PF) SYRINGE
PREFILLED_SYRINGE | INTRAVENOUS | Status: AC
  Filled 2020-03-29: qty 10

## 2020-03-29 MED ORDER — ONDANSETRON HCL 4 MG/2ML IJ SOLN
INTRAMUSCULAR | Status: DC | PRN
  Administered 2020-03-29: 4 mg via INTRAVENOUS

## 2020-03-29 MED ORDER — BUPIVACAINE-EPINEPHRINE (PF) 0.25% -1:200000 IJ SOLN
INTRAMUSCULAR | Status: AC
  Filled 2020-03-29: qty 30

## 2020-03-29 MED ORDER — OXYCODONE HCL 5 MG PO TABS
5.0000 mg | ORAL_TABLET | Freq: Four times a day (QID) | ORAL | Status: DC | PRN
  Administered 2020-03-30 – 2020-04-01 (×6): 5 mg via ORAL
  Filled 2020-03-29 (×6): qty 1

## 2020-03-29 MED ORDER — SUCCINYLCHOLINE CHLORIDE 200 MG/10ML IV SOSY
PREFILLED_SYRINGE | INTRAVENOUS | Status: DC | PRN
  Administered 2020-03-29: 120 mg via INTRAVENOUS

## 2020-03-29 MED ORDER — LIDOCAINE 2% (20 MG/ML) 5 ML SYRINGE
INTRAMUSCULAR | Status: DC | PRN
  Administered 2020-03-29: 40 mg via INTRAVENOUS

## 2020-03-29 MED ORDER — BUPIVACAINE LIPOSOME 1.3 % IJ SUSP
20.0000 mL | Freq: Once | INTRAMUSCULAR | Status: AC
  Administered 2020-03-29: 20 mL
  Filled 2020-03-29: qty 20

## 2020-03-29 MED ORDER — LACTATED RINGERS IV SOLN: INTRAVENOUS | Status: DC | PRN

## 2020-03-29 MED ORDER — LACTATED RINGERS IR SOLN
Status: DC | PRN
  Administered 2020-03-29: 1

## 2020-03-29 MED ORDER — MEPERIDINE HCL 50 MG/ML IJ SOLN: 6.2500 mg | INTRAMUSCULAR | Status: DC | PRN

## 2020-03-29 MED ORDER — PHENYLEPHRINE 40 MCG/ML (10ML) SYRINGE FOR IV PUSH (FOR BLOOD PRESSURE SUPPORT)
PREFILLED_SYRINGE | INTRAVENOUS | Status: AC
  Filled 2020-03-29: qty 30

## 2020-03-29 MED ORDER — LIDOCAINE 20MG/ML (2%) 15 ML SYRINGE OPTIME
INTRAMUSCULAR | Status: DC | PRN
  Administered 2020-03-29: 1.5 mg/kg/h via INTRAVENOUS

## 2020-03-29 MED ORDER — EPHEDRINE 5 MG/ML INJ
INTRAVENOUS | Status: AC
  Filled 2020-03-29: qty 10

## 2020-03-29 MED ORDER — DEXAMETHASONE SODIUM PHOSPHATE 10 MG/ML IJ SOLN
INTRAMUSCULAR | Status: AC
  Filled 2020-03-29: qty 1

## 2020-03-29 MED ORDER — DEXAMETHASONE SODIUM PHOSPHATE 10 MG/ML IJ SOLN
INTRAMUSCULAR | Status: DC | PRN
  Administered 2020-03-29: 5 mg via INTRAVENOUS

## 2020-03-29 MED ORDER — SUCCINYLCHOLINE CHLORIDE 200 MG/10ML IV SOSY
PREFILLED_SYRINGE | INTRAVENOUS | Status: AC
  Filled 2020-03-29: qty 10

## 2020-03-29 MED ORDER — POTASSIUM CHLORIDE 10 MEQ/100ML IV SOLN
10.0000 meq | INTRAVENOUS | Status: AC
  Administered 2020-03-29: 10 meq via INTRAVENOUS
  Filled 2020-03-29: qty 100

## 2020-03-29 MED ORDER — ONDANSETRON HCL 4 MG/2ML IJ SOLN
INTRAMUSCULAR | Status: AC
  Filled 2020-03-29: qty 2

## 2020-03-29 MED ORDER — ROCURONIUM BROMIDE 10 MG/ML (PF) SYRINGE
PREFILLED_SYRINGE | INTRAVENOUS | Status: DC | PRN
  Administered 2020-03-29: 40 mg via INTRAVENOUS
  Administered 2020-03-29: 20 mg via INTRAVENOUS

## 2020-03-29 MED ORDER — LIDOCAINE 2% (20 MG/ML) 5 ML SYRINGE
INTRAMUSCULAR | Status: AC
  Filled 2020-03-29: qty 5

## 2020-03-29 MED ORDER — FENTANYL CITRATE (PF) 100 MCG/2ML IJ SOLN
INTRAMUSCULAR | Status: AC
  Filled 2020-03-29: qty 2

## 2020-03-29 MED ORDER — HYDROMORPHONE HCL 1 MG/ML IJ SOLN: 0.2500 mg | INTRAMUSCULAR | Status: DC | PRN

## 2020-03-29 MED ORDER — ONDANSETRON HCL 4 MG/2ML IJ SOLN: 4.0000 mg | Freq: Once | INTRAMUSCULAR | Status: DC | PRN

## 2020-03-29 MED ORDER — 0.9 % SODIUM CHLORIDE (POUR BTL) OPTIME
TOPICAL | Status: DC | PRN
  Administered 2020-03-29: 1000 mL

## 2020-03-29 MED ORDER — PHENYLEPHRINE HCL (PRESSORS) 10 MG/ML IV SOLN
INTRAVENOUS | Status: AC
  Filled 2020-03-29: qty 1

## 2020-03-29 MED ORDER — EPHEDRINE SULFATE-NACL 50-0.9 MG/10ML-% IV SOSY
PREFILLED_SYRINGE | INTRAVENOUS | Status: DC | PRN
  Administered 2020-03-29: 10 mg via INTRAVENOUS
  Administered 2020-03-29: 5 mg via INTRAVENOUS
  Administered 2020-03-29: 10 mg via INTRAVENOUS
  Administered 2020-03-29: 5 mg via INTRAVENOUS

## 2020-03-29 MED ORDER — FENTANYL CITRATE (PF) 250 MCG/5ML IJ SOLN
INTRAMUSCULAR | Status: DC | PRN
Start: 2020-03-29 — End: 2020-03-29
  Administered 2020-03-29 (×2): 50 ug via INTRAVENOUS

## 2020-03-29 MED ORDER — BUPIVACAINE-EPINEPHRINE 0.25% -1:200000 IJ SOLN
INTRAMUSCULAR | Status: DC | PRN
  Administered 2020-03-29: 30 mL

## 2020-03-29 MED ORDER — PROPOFOL 10 MG/ML IV BOLUS
INTRAVENOUS | Status: DC | PRN
  Administered 2020-03-29: 30 mg via INTRAVENOUS
  Administered 2020-03-29: 140 mg via INTRAVENOUS

## 2020-03-29 SURGICAL SUPPLY — 63 items
APPLIER CLIP ROT 10 11.4 M/L (STAPLE)
BAG LAPAROSCOPIC 12 15 PORT 16 (BASKET) ×1 IMPLANT
BAG RETRIEVAL 12/15 (BASKET) ×2
BLADE HEX COATED 2.75 (ELECTRODE) ×2 IMPLANT
CLIP APPLIE ROT 10 11.4 M/L (STAPLE) IMPLANT
CLIP SUT LAPRA TY ABSORB (SUTURE) ×2 IMPLANT
COVER MAYO STAND STRL (DRAPES) ×2 IMPLANT
COVER SURGICAL LIGHT HANDLE (MISCELLANEOUS) ×2 IMPLANT
COVER WAND RF STERILE (DRAPES) IMPLANT
DERMABOND ADVANCED (GAUZE/BANDAGES/DRESSINGS)
DERMABOND ADVANCED .7 DNX12 (GAUZE/BANDAGES/DRESSINGS) IMPLANT
DRAPE LAPAROSCOPIC ABDOMINAL (DRAPES) ×2 IMPLANT
DRAPE WARM FLUID 44X44 (DRAPES) ×2 IMPLANT
ELECT PENCIL ROCKER SW 15FT (MISCELLANEOUS) ×2 IMPLANT
ELECT REM PT RETURN 15FT ADLT (MISCELLANEOUS) ×2 IMPLANT
ENDOLOOP SUT PDS II  0 18 (SUTURE)
ENDOLOOP SUT PDS II 0 18 (SUTURE) IMPLANT
GAUZE SPONGE 4X4 12PLY STRL (GAUZE/BANDAGES/DRESSINGS) ×2 IMPLANT
GLOVE BIO SURGEON STRL SZ7 (GLOVE) ×2 IMPLANT
GLOVE BIOGEL PI IND STRL 7.0 (GLOVE) ×1 IMPLANT
GLOVE BIOGEL PI IND STRL 7.5 (GLOVE) ×1 IMPLANT
GLOVE BIOGEL PI INDICATOR 7.0 (GLOVE) ×1
GLOVE BIOGEL PI INDICATOR 7.5 (GLOVE) ×1
GOWN SPEC L4 XLG W/TWL (GOWN DISPOSABLE) ×2 IMPLANT
GOWN STRL REUS W/TWL LRG LVL3 (GOWN DISPOSABLE) ×4 IMPLANT
GOWN STRL REUS W/TWL XL LVL3 (GOWN DISPOSABLE) ×8 IMPLANT
KIT BASIN OR (CUSTOM PROCEDURE TRAY) ×2 IMPLANT
KIT TURNOVER KIT A (KITS) IMPLANT
MARKER SKIN DUAL TIP RULER LAB (MISCELLANEOUS) IMPLANT
NS IRRIG 1000ML POUR BTL (IV SOLUTION) ×4 IMPLANT
PENCIL SMOKE EVACUATOR (MISCELLANEOUS) IMPLANT
POUCH SPECIMEN RETRIEVAL 10MM (ENDOMECHANICALS) IMPLANT
SCISSORS LAP 5X35 DISP (ENDOMECHANICALS) ×2 IMPLANT
SET IRRIG TUBING LAPAROSCOPIC (IRRIGATION / IRRIGATOR) ×2 IMPLANT
SET TUBE SMOKE EVAC HIGH FLOW (TUBING) ×2 IMPLANT
SHEARS HARMONIC ACE PLUS 36CM (ENDOMECHANICALS) ×2 IMPLANT
SLEEVE ADV FIXATION 5X100MM (TROCAR) ×4 IMPLANT
SPONGE LAP 18X18 RF (DISPOSABLE) ×2 IMPLANT
STAPLER 90 3.5 STAND SLIM (STAPLE)
STAPLER 90 3.5 STD SLIM (STAPLE) IMPLANT
STAPLER ECHELON LONG 60 440 (INSTRUMENTS) IMPLANT
STAPLER VISISTAT 35W (STAPLE) ×2 IMPLANT
SUT MNCRL AB 4-0 PS2 18 (SUTURE) IMPLANT
SUT PDS AB 3-0 SH 27 (SUTURE) ×6 IMPLANT
SUT SILK 2 0 (SUTURE) ×1
SUT SILK 2 0 SH CR/8 (SUTURE) ×2 IMPLANT
SUT SILK 2-0 18XBRD TIE 12 (SUTURE) ×1 IMPLANT
SUT SILK 3 0 (SUTURE)
SUT SILK 3 0 SH CR/8 (SUTURE) IMPLANT
SUT SILK 3-0 18XBRD TIE 12 (SUTURE) IMPLANT
SUT VIC AB 2-0 SH 27 (SUTURE) ×1
SUT VIC AB 2-0 SH 27X BRD (SUTURE) ×1 IMPLANT
TIP INNERVISION DETACH 40FR (MISCELLANEOUS) IMPLANT
TIP INNERVISION DETACH 50FR (MISCELLANEOUS) IMPLANT
TIP INNERVISION DETACH 56FR (MISCELLANEOUS) IMPLANT
TIPS INNERVISION DETACH 40FR (MISCELLANEOUS)
TOWEL OR 17X26 10 PK STRL BLUE (TOWEL DISPOSABLE) ×2 IMPLANT
TRAY FOLEY MTR SLVR 16FR STAT (SET/KITS/TRAYS/PACK) ×2 IMPLANT
TRAY LAPAROSCOPIC (CUSTOM PROCEDURE TRAY) ×2 IMPLANT
TROCAR XCEL BLUNT TIP 100MML (ENDOMECHANICALS) IMPLANT
TROCAR XCEL NON-BLD 11X100MML (ENDOMECHANICALS) IMPLANT
TROCAR XCEL UNIV SLVE 11M 100M (ENDOMECHANICALS) IMPLANT
YANKAUER SUCT BULB TIP 10FT TU (MISCELLANEOUS) ×2 IMPLANT

## 2020-03-29 NOTE — Telephone Encounter (Signed)
Received call report from CT scan done on pt 03/26/20. Pt is currently admitted.

## 2020-03-29 NOTE — Anesthesia Preprocedure Evaluation (Signed)
Anesthesia Evaluation  Patient identified by MRN, date of birth, ID band Patient awake    Reviewed: Allergy & Precautions, NPO status , Patient's Chart, lab work & pertinent test results, reviewed documented beta blocker date and time   Airway Mallampati: II  TM Distance: >3 FB Neck ROM: Full    Dental no notable dental hx. (+) Teeth Intact, Dental Advisory Given   Pulmonary neg pulmonary ROS,    Pulmonary exam normal breath sounds clear to auscultation       Cardiovascular hypertension, Pt. on medications and Pt. on home beta blockers Normal cardiovascular exam Rhythm:Regular Rate:Normal     Neuro/Psych negative neurological ROS     GI/Hepatic Neg liver ROS,   Endo/Other    Renal/GU Renal InsufficiencyRenal disease  negative genitourinary   Musculoskeletal negative musculoskeletal ROS (+)   Abdominal Normal abdominal exam  (+)   Peds  Hematology  (+) Blood dyscrasia, anemia ,   Anesthesia Other Findings   Reproductive/Obstetrics                             Anesthesia Physical  Anesthesia Plan  ASA: II  Anesthesia Plan: General   Post-op Pain Management:    Induction:   PONV Risk Score and Plan: 4 or greater and Ondansetron, Dexamethasone and Treatment may vary due to age or medical condition  Airway Management Planned: Oral ETT  Additional Equipment: None  Intra-op Plan:   Post-operative Plan: Extubation in OR  Informed Consent: I have reviewed the patients History and Physical, chart, labs and discussed the procedure including the risks, benefits and alternatives for the proposed anesthesia with the patient or authorized representative who has indicated his/her understanding and acceptance.       Plan Discussed with: CRNA  Anesthesia Plan Comments:         Anesthesia Quick Evaluation

## 2020-03-29 NOTE — Progress Notes (Signed)
PROGRESS NOTE    Lori Rush  LDJ:[id number]  DOB: Mar 20, 1946  PCP: Glendon Axe, MD [place] date:03/26/2020 Chief compliant: Abnormal labs, low hemoglobin 74 year old female with history of hypertension, CKD stage II, iron deficiency anemia, was admitted for symptomatic anemia in 12/2019.  At that time patient had undergone capsule endoscopy which did not show any reason for anemia.  Patient had endoscopy on 8/26 which showed normal esophagus, small hiatal hernia, abnormal lesion 2 cm, submucosal, no active bleeding, fresh blood in the duodenum, was irrigated but no etiology was found.  Patient was seen at PCPs office on 8/26 and was informed that her hemoglobin was very low and recommended ER evaluation and blood transfusion.  Patient also reported she had melanotic stools for last several weeks otherwise no abdominal pain.  She also reported increasing fatigue, generalized weakness, no chest pain or shortness of breath. Hospital course: Patient admitted to [facility] for further work-up with GI consultation.  Received 2 units of PRBC transfusion on 8/27 and hemoglobin improved to 10.  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  Subjective:  Patient taken to the OR this morning for gastric tumor resection.  Objective: Vitals:   03/29/20 1404 03/29/20 1527 03/29/20 1610 03/29/20 1712  BP: (!) 161/79 (!) 148/77 (!) 144/72 (!) 153/78  Pulse: (!) 50 (!) 49 (!) 53 (!) 51  Resp: 14 16 16 16   Temp: 97.7 F (36.5 C) 98.1 F (36.7 C) 98.1 F (36.7 C) 98 F (36.7 C)  TempSrc:      SpO2: 100% 100% 100% 100%  Weight:      Height:        Intake/Output Summary (Last 24 hours) at 03/29/2020 1943 Last data filed at 03/29/2020 1700 Gross per 24 hour  Intake 1637 ml  Output 1025 ml  Net 612 ml   Filed Weights   03/26/20 0425 03/29/20 0828  Weight: 58.7 kg 58.7 kg    Physical Examination:  General: Moderately built, no acute  distress noted Head ENT: Atraumatic normocephalic, PERRLA, neck supple Heart: S1-S2 heard, regular rate and rhythm, no murmurs.  No leg edema noted Lungs: Equal air entry bilaterally, no rhonchi or rales on exam, no accessory muscle use Abdomen: Mildly distended, bowel sounds heard, soft, Band-Aid at laparoscopic sites, no significant tenderness. No organomegaly.  No CVA tenderness Extremities: No pedal edema.  No cyanosis or clubbing. Neurological: Awake alert oriented x3, no focal weakness or numbness, strength and sensations to crude touch intact Skin: No wounds or rashes.     Data Reviewed: I have personally reviewed following labs and imaging studies  CBC: Recent Labs  Lab 03/25/20 1938 03/25/20 1938 03/26/20 0649 03/26/20 1719 03/27/20 0336 03/28/20 0258 03/29/20 0355  WBC 6.1  --  4.1  --  6.3 6.9 6.4  HGB 7.0*   < > 6.1* 11.2* 10.0* 10.2* 10.0*  HCT 23.4*   < > 20.8* 36.2 31.3* 33.1* 32.4*  MCV 85.4  --  86.0  --  84.1 87.1 86.6  PLT 224  --  193  --  190 213 222   < > = values in this interval not displayed.   Basic Metabolic Panel: Recent Labs  Lab 03/25/20 1938 03/26/20 0649 03/27/20 0336 03/28/20 0258 03/29/20 0355  NA 139 141 142 139 137  K 3.1* 3.0* 3.2* 4.0 3.3*  CL 106 109 107 107 102  CO2 25 25 26 25 25   GLUCOSE 140* 121* 99  91 106*  BUN 16 12 6* 14 19  CREATININE 1.45* 0.93 0.77 0.94 1.02*  CALCIUM 8.7* 8.2* 8.8* 8.7* 8.5*   GFR: Estimated Creatinine Clearance: 37.1 mL/min (A) (by C-G formula based on SCr of 1.02 mg/dL (H)). Liver Function Tests: Recent Labs  Lab 03/25/20 1938  AST 23  ALT 16  ALKPHOS 59  BILITOT 0.5  PROT 6.9  ALBUMIN 3.5   No results for input(s): LIPASE, AMYLASE in the last 168 hours. No results for input(s): AMMONIA in the last 168 hours. Coagulation Profile: No results for input(s): INR, PROTIME in the last 168 hours. Cardiac Enzymes: No results for input(s): CKTOTAL, CKMB, CKMBINDEX, TROPONINI in the last 168  hours. BNP (last 3 results) No results for input(s): PROBNP in the last 8760 hours. HbA1C: No results for input(s): HGBA1C in the last 72 hours. CBG: Recent Labs  Lab 03/28/20 0719 03/28/20 1559 03/28/20 2357 03/29/20 0810 03/29/20 1709  GLUCAP 92 106* 97 101* 125*   Lipid Profile: No results for input(s): CHOL, HDL, LDLCALC, TRIG, CHOLHDL, LDLDIRECT in the last 72 hours. Thyroid Function Tests: No results for input(s): TSH, T4TOTAL, FREET4, T3FREE, THYROIDAB in the last 72 hours. Anemia Panel: No results for input(s): VITAMINB12, FOLATE, FERRITIN, TIBC, IRON, RETICCTPCT in the last 72 hours. Sepsis Labs: No results for input(s): PROCALCITON, LATICACIDVEN in the last 168 hours.  Recent Results (from the past 240 hour(s))  SARS Coronavirus 2 by RT PCR (hospital order, performed in [facility] hospital lab) Nasopharyngeal Nasopharyngeal Swab     Status: None   Collection Time: 03/26/20  2:30 AM   Specimen: Nasopharyngeal Swab  Result Value Ref Range Status   SARS Coronavirus 2 NEGATIVE NEGATIVE Final    Comment: (NOTE) SARS-CoV-2 target nucleic acids are NOT DETECTED.  The SARS-CoV-2 RNA is generally detectable in upper and lower respiratory specimens during the acute phase of infection. The lowest concentration of SARS-CoV-2 viral copies this assay can detect is 250 copies / mL. A negative result does not preclude SARS-CoV-2 infection and should not be used as the sole basis for treatment or other patient management decisions.  A negative result may occur with improper specimen collection / handling, submission of specimen other than nasopharyngeal swab, presence of viral mutation(s) within the areas targeted by this assay, and inadequate number of viral copies (<250 copies / mL). A negative result must be combined with clinical observations, patient history, and epidemiological information.  Fact Sheet for Patients:   StrictlyIdeas.no  Fact  Sheet for Healthcare Providers: BankingDealers.co.za  This test is not yet approved or  cleared by the [place] FDA and has been authorized for detection and/or diagnosis of SARS-CoV-2 by FDA under an Emergency Use Authorization (EUA).  This EUA will remain in effect (meaning this test can be used) for the duration of the COVID-19 declaration under Section 564(b)(1) of the Act, 21 U.S.C. section 360bbb-3(b)(1), unless the authorization is terminated or revoked sooner.  Performed at [facility], [place] [address], [place], [place] [address]   Surgical pcr screen     Status: None   Collection Time: 03/29/20  6:04 AM   Specimen: Nasal Mucosa; Nasal Swab  Result Value Ref Range Status   MRSA, PCR NEGATIVE NEGATIVE Final   Staphylococcus aureus NEGATIVE NEGATIVE Final    Comment: (NOTE) The Xpert SA Assay (FDA approved for NASAL specimens in patients 61 years of age and older), is one component of a comprehensive surveillance program. It is not intended to  diagnose infection nor to guide or monitor treatment. Performed at [facility], [place] [address], [place], [place] [address]       Radiology Studies: [facility] Chest Port 1 View  Result Date: 03/29/2020 CLINICAL DATA:  Preoperative respiratory evaluation. EXAM: PORTABLE CHEST 1 VIEW COMPARISON:  None. FINDINGS: 0527 hours. The lungs are clear without focal pneumonia, edema, pneumothorax or pleural effusion. The cardio pericardial silhouette is enlarged. The visualized bony structures of the thorax show now acute abnormality. IMPRESSION: No active disease. Electronically Signed   By: Misty Stanley M.D.   On: 03/29/2020 07:40      Scheduled Meds: . sodium chloride   Intravenous Once  . amLODipine  5 mg Oral Daily  . vitamin C  250 mg Oral Daily  . feeding supplement (ENSURE ENLIVE)  237 mL Oral BID BM  . fluticasone  1 spray Each Nare Daily  . metoprolol tartrate  100 mg  Oral BID  . multivitamin with minerals  1 tablet Oral Daily  . pantoprazole  40 mg Oral BID AC   Continuous Infusions: . sodium chloride Stopped (03/26/20 1639)     Assessment/Plan:  1.  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  General surgery consulted per GI recommendations and patient underwent Laparoscopic resection of gastric tumor today.  Tolerated procedure well.  Hemoglobin stable around 10.  Repeat labs in a.m.  Noted orders for clear liquid diet this afternoon.  2.  Hypertension: On Norvasc, metoprolol.  Watch for hypotension.  3.  AKI: Present on admission with creatinine at 1.4.  Now improved to 0.9 with fluid resuscitation.  4.  Hypokalemia: Replaced but low again this morning.  Will replace IV.  Labs in a.m.  DVT prophylaxis: SCDs Code Status: Full code Family / Patient Communication: Discussed with patient Disposition Plan:   Status is: Inpatient  Remains inpatient appropriate because:Inpatient level of care appropriate due to severity of illness   Dispo: The patient is from: Home              Anticipated d/c is to: TBD              Anticipated d/c date is: 2 days              Patient currently is not medically stable to d/c.   Time spent: 25 minutes     >50% time spent in discussions with care team and coordination of care.    Guilford Shi, MD [facility] Pager in [place]  If 7PM-7AM, please contact night-coverage [email or web address] 03/29/2020, 7:43 PM

## 2020-03-29 NOTE — Anesthesia Procedure Notes (Signed)
Procedure Name: Intubation Date/Time: 03/29/2020 9:34 AM Performed by: Eben Burow, CRNA Pre-anesthesia Checklist: Patient identified, Emergency Drugs available, Suction available, Patient being monitored and Timeout performed Patient Re-evaluated:Patient Re-evaluated prior to induction Oxygen Delivery Method: Circle system utilized Preoxygenation: Pre-oxygenation with 100% oxygen Induction Type: IV induction Ventilation: Mask ventilation without difficulty Laryngoscope Size: Mac and 4 Grade View: Grade I Tube type: Oral Tube size: 7.0 mm Number of attempts: 1 Airway Equipment and Method: Stylet Placement Confirmation: ETT inserted through vocal cords under direct vision,  positive ETCO2 and breath sounds checked- equal and bilateral Secured at: 22 cm Tube secured with: Tape Dental Injury: Teeth and Oropharynx as per pre-operative assessment  Comments: Intubated by Darden Palmer paramedic student, supervised by Dr Lissa Hoard.

## 2020-03-29 NOTE — Plan of Care (Signed)
Plan of care reviewed and discussed with the patient. 

## 2020-03-29 NOTE — Anesthesia Postprocedure Evaluation (Deleted)
Anesthesia Post Note  Patient: Lori Rush  Procedure(s) Performed: BIOPSY ESOPHAGOGASTRODUODENOSCOPY (EGD) WITH PROPOFOL (N/A )     Patient location during evaluation: Endoscopy Anesthesia Type: MAC Level of consciousness: awake Pain management: pain level controlled Vital Signs Assessment: post-procedure vital signs reviewed and stable Respiratory status: spontaneous breathing Cardiovascular status: stable Postop Assessment: no apparent nausea or vomiting Anesthetic complications: no   No complications documented.  Last Vitals:  Vitals:   03/29/20 1610 03/29/20 1712  BP: (!) 144/72 (!) 153/78  Pulse: (!) 53 (!) 51  Resp: 16 16  Temp: 36.7 C 36.7 C  SpO2: 100% 100%    Last Pain:  Vitals:   03/29/20 1945  TempSrc:   PainSc: [place] Jr

## 2020-03-29 NOTE — Anesthesia Postprocedure Evaluation (Deleted)
Anesthesia Post Note  Patient: Lori Rush  Procedure(s) Performed: BIOPSY ESOPHAGOGASTRODUODENOSCOPY (EGD) WITH PROPOFOL (N/A )     Anesthesia Post Evaluation No complications documented.  Last Vitals:  Vitals:   03/29/20 1610 03/29/20 1712  BP: (!) 144/72 (!) 153/78  Pulse: (!) 53 (!) 51  Resp: 16 16  Temp: 36.7 C 36.7 C  SpO2: 100% 100%    Last Pain:  Vitals:   03/29/20 1945  TempSrc:   PainSc: [place] Jr

## 2020-03-29 NOTE — Transfer of Care (Signed)
Immediate Anesthesia Transfer of Care Note  Patient: Lori Rush  Procedure(s) Performed: LAPAROSCOPIC GASTRECTOMY partial (N/A Abdomen)  Patient Location: PACU  Anesthesia Type:General  Level of Consciousness: drowsy and patient cooperative  Airway & Oxygen Therapy: Patient Spontanous Breathing and Patient connected to face mask oxygen  Post-op Assessment: Report given to RN and Post -op Vital signs reviewed and stable  Post vital signs: Reviewed and stable  Last Vitals:  Vitals Value Taken Time  BP 174/84 03/29/20 1213  Temp    Pulse 53 03/29/20 1215  Resp 19 03/29/20 1215  SpO2 100 % 03/29/20 1215  Vitals shown include unvalidated device data.  Last Pain:  Vitals:   03/29/20 0828  TempSrc:   PainSc: 0-No pain      Patients Stated Pain Goal: 0 (07/16/23 4695)  Complications: No complications documented.

## 2020-03-29 NOTE — Anesthesia Postprocedure Evaluation (Signed)
Anesthesia Post Note  Patient: Lori Rush  Procedure(s) Performed: LAPAROSCOPIC GASTRECTOMY partial (N/A Abdomen)     Patient location during evaluation: PACU Anesthesia Type: General Level of consciousness: sedated and patient cooperative Pain management: pain level controlled Vital Signs Assessment: post-procedure vital signs reviewed and stable Respiratory status: spontaneous breathing Cardiovascular status: stable Anesthetic complications: no   No complications documented.  Last Vitals:  Vitals:   03/29/20 1527 03/29/20 1610  BP: (!) 148/77 (!) 144/72  Pulse: (!) 49 (!) 53  Resp: 16 16  Temp: 36.7 C 36.7 C  SpO2: 100% 100%    Last Pain:  Vitals:   03/29/20 1404  TempSrc:   PainSc: 0-No pain                 Nolon Nations

## 2020-03-29 NOTE — Anesthesia Postprocedure Evaluation (Signed)
Anesthesia Post Note  Patient: Lori Rush  Procedure(s) Performed: BIOPSY ESOPHAGOGASTRODUODENOSCOPY (EGD) WITH PROPOFOL (N/A )     Patient location during evaluation: Endoscopy Anesthesia Type: MAC Level of consciousness: awake Pain management: pain level controlled Vital Signs Assessment: post-procedure vital signs reviewed and stable Respiratory status: spontaneous breathing Cardiovascular status: stable Postop Assessment: no apparent nausea or vomiting Anesthetic complications: no   No complications documented.  Last Vitals:  Vitals:   03/29/20 1610 03/29/20 1712  BP: (!) 144/72 (!) 153/78  Pulse: (!) 53 (!) 51  Resp: 16 16  Temp: 36.7 C 36.7 C  SpO2: 100% 100%    Last Pain:  Vitals:   03/29/20 1945  TempSrc:   PainSc: 2                  John F Pruitt Taboada Jr     

## 2020-03-29 NOTE — Progress Notes (Signed)
Pre Procedure note for inpatients:   Lori Rush has been scheduled for laparoscopic gastric tumor resection, possible gastrojejunostomy today. The various methods of treatment have been discussed with the patient. After consideration of the risks, benefits and treatment options the patient has consented to the planned procedure.   The patient has been seen and labs reviewed. There are no changes in the patient's condition to prevent proceeding with the planned procedure today.  Recent labs:  Lab Results  Component Value Date   WBC 6.4 03/29/2020   HGB 10.0 (L) 03/29/2020   HCT 32.4 (L) 03/29/2020   PLT 222 03/29/2020   GLUCOSE 106 (H) 03/29/2020   ALT 16 03/25/2020   AST 23 03/25/2020   NA 137 03/29/2020   K 3.3 (L) 03/29/2020   CL 102 03/29/2020   CREATININE 1.02 (H) 03/29/2020   BUN 19 03/29/2020   CO2 25 03/29/2020    Mickeal Skinner, MD 03/29/2020 8:00 AM

## 2020-03-29 NOTE — Op Note (Addendum)
Preoperative diagnosis: stomach tumor  Postoperative diagnosis: same   Procedure: laparoscopic wedge gastrectomy of lesser curve  Surgeon: Gurney Maxin, M.D.  Asst: Barkley Boards [facility]  Anesthesia: general  Indications for procedure: Lori Rush is a 74 y.o. year old female with symptoms of bleeding and work up showing antral submucosal mass. After discussion decision was made to proceed with laparoscopic resection of gastric tumor.  Description of procedure: The patient was brought into the operative suite. Anesthesia was administered with General endotracheal anesthesia. WHO checklist was applied. The patient was then placed in supine position. The area was prepped and draped in the usual sterile fashion.  A small left subcostal incision was made. A 52mm trocar was used to gain access to the peritoneal cavity by optical entry technique. Pneumoperitoneum was applied with a high flow and low pressure. The laparoscope was reinserted to confirm position.  4 additional trocars were placed; 1 5 mm trocar in the left lateral space, 1 5 mm trocar in the right upper quadrant, 1 5 mm trocar in the left periumbilical space, and 1 12 mm trocar in the right mid abdomen. Marcaine/Exparel mix was used to create bilateral TAP blocks.  The stomach was inspected. The pylorus was identified. A large white mass was seen at the incisura. It appeared to involve 30% of the stomach wall in the area. The lesser curve vessels were divided directly next to the mass using harmonic scalpel. Once all attachments to the mass were removed, inspection of the mass showed that trying to isolate the mass with a stapler would likely narrow the stomach too much. Therefore, a gastrotomy was made with harmonic scalpel and the stomach wall was divided to remove the mass. The mass was stitched with 2-0 vicryl at the superior posterior aspect of the mass. The mass was placed in an endocatch bag. The large gastrotomy left again would  create narrowing if closed in longitudinal fashion. Therefore the defect was closed transversely to maintain lumen diameter. 3-0 PDS was used in full thickness running fashion. Air was inserted into the stomach after closure and the stomach closure was airtight.  Th 12 mm trocar site was enlarged at the skin and fascia with cautery and the endocatch bag and mass were removed. The fascia was closed with 0 PDS in running fashion. The skin was closed with 4-0 monocryl in subcuticular fashion. All counts were correct. The patient was brought to pacu in stable condition.  Findings: 6 cm mass on the lesser curve at gastric incisura  Specimen: stomach mass with vicryl marks superior/proximal posterior site.  Implant: none   Blood loss: 30 ml  Local anesthesia: 50 ml Exparel:Marcaine Mix  Complications: none      Gurney Maxin, M.D. General, Bariatric, & Minimally Invasive Surgery [facility]

## 2020-03-30 ENCOUNTER — Encounter (HOSPITAL_COMMUNITY): Payer: Self-pay | Admitting: General Surgery

## 2020-03-30 ENCOUNTER — Other Ambulatory Visit: Payer: Self-pay

## 2020-03-30 DIAGNOSIS — R935 Abnormal findings on diagnostic imaging of other abdominal regions, including retroperitoneum: Secondary | ICD-10-CM

## 2020-03-30 LAB — CBC
HCT: 30.8 % — ABNORMAL LOW (ref 36.0–46.0)
Hemoglobin: 9.7 g/dL — ABNORMAL LOW (ref 12.0–15.0)
MCH: 26.8 pg (ref 26.0–34.0)
MCHC: 31.5 g/dL (ref 30.0–36.0)
MCV: 85.1 fL (ref 80.0–100.0)
Platelets: 199 10*3/uL (ref 150–400)
RBC: 3.62 MIL/uL — ABNORMAL LOW (ref 3.87–5.11)
RDW: 17.2 % — ABNORMAL HIGH (ref 11.5–15.5)
WBC: 9 10*3/uL (ref 4.0–10.5)
nRBC: 1.4 % — ABNORMAL HIGH (ref 0.0–0.2)

## 2020-03-30 LAB — BASIC METABOLIC PANEL
Anion gap: 8 (ref 5–15)
BUN: 15 mg/dL (ref 8–23)
CO2: 26 mmol/L (ref 22–32)
Calcium: 8.1 mg/dL — ABNORMAL LOW (ref 8.9–10.3)
Chloride: 105 mmol/L (ref 98–111)
Creatinine, Ser: 0.84 mg/dL (ref 0.44–1.00)
GFR calc Af Amer: >60 mL/min (ref >60)
GFR calc non Af Amer: >60 mL/min (ref >60)
Glucose, Bld: 106 mg/dL — ABNORMAL HIGH (ref 70–99)
Potassium: 4 mmol/L (ref 3.5–5.1)
Sodium: 139 mmol/L (ref 135–145)

## 2020-03-30 LAB — GLUCOSE, CAPILLARY
Glucose-Capillary: 85 mg/dL (ref 70–99)
Glucose-Capillary: 109 mg/dL — ABNORMAL HIGH (ref 70–99)

## 2020-03-30 MED ORDER — ENOXAPARIN SODIUM 40 MG/0.4ML ~~LOC~~ SOLN
40.0000 mg | SUBCUTANEOUS | Status: DC
  Administered 2020-03-30 – 2020-04-01 (×3): 40 mg via SUBCUTANEOUS
  Filled 2020-03-30 (×3): qty 0.4

## 2020-03-30 MED ORDER — ACETAMINOPHEN 325 MG PO TABS: 650.0000 mg | ORAL_TABLET | Freq: Four times a day (QID) | ORAL | Status: DC | PRN

## 2020-03-30 NOTE — Telephone Encounter (Signed)
Referral entered in epic for Gyn

## 2020-03-30 NOTE — Progress Notes (Signed)
PROGRESS NOTE    Lori Rush  SNK:[id number]  DOB: 15-Dec-1945  PCP: Glendon Axe, MD [place] date:03/26/2020 Chief compliant: Abnormal labs, low hemoglobin 74 year old female with history of hypertension, CKD stage II, iron deficiency anemia, was admitted for symptomatic anemia in 12/2019.  At that time patient had undergone capsule endoscopy which did not show any reason for anemia.  Patient had endoscopy on 8/26 which showed normal esophagus, small hiatal hernia, abnormal lesion 2 cm, submucosal, no active bleeding, fresh blood in the duodenum, was irrigated but no etiology was found.  Patient was seen at PCPs office on 8/26 and was informed that her hemoglobin was very low and recommended ER evaluation and blood transfusion.  Patient also reported she had melanotic stools for last several weeks otherwise no abdominal pain.  She also reported increasing fatigue, generalized weakness, no chest pain or shortness of breath. Hospital course: Patient admitted to [facility] for further work-up with GI consultation.  Received 2 units of PRBC transfusion on 8/27 and hemoglobin improved to 10.  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  Subjective:  POD 1 for gastric tumor resection.Feels okay and tolerating liquids . C/O weakness . Sister at bedside  Objective: Vitals:   03/29/20 1712 03/29/20 2135 03/30/20 0145 03/30/20 0558  BP: (!) 153/78 130/67 139/75 134/76  Pulse: (!) 51 (!) 54 (!) 52 (!) 57  Resp: 16 17 15 18   Temp: 98 F (36.7 C) 98.4 F (36.9 C) 98.1 F (36.7 C) 98.3 F (36.8 C)  TempSrc:      SpO2: 100% 100% 99% 99%  Weight:      Height:        Intake/Output Summary (Last 24 hours) at 03/30/2020 1654 Last data filed at 03/30/2020 1400 Gross per 24 hour  Intake 530 ml  Output 2050 ml  Net -1520 ml   Filed Weights   03/26/20 0425 03/29/20 0828  Weight: 58.7 kg 58.7 kg    Physical Examination:  General:  Moderately built, no acute distress noted Head ENT: Atraumatic normocephalic, PERRLA, neck supple Heart: S1-S2 heard, regular rate and rhythm, no murmurs.  No leg edema noted Lungs: Equal air entry bilaterally, no rhonchi or rales on exam, no accessory muscle use Abdomen: Mildly distended, bowel sounds heard, soft, steristrips at laparoscopic sites, no significant tenderness. No organomegaly.  No CVA tenderness Extremities: No pedal edema.  No cyanosis or clubbing. Neurological: Awake alert oriented x3, no focal weakness or numbness, strength and sensations to crude touch intact      Data Reviewed: I have personally reviewed following labs and imaging studies  CBC: Recent Labs  Lab 03/26/20 0649 03/26/20 0649 03/26/20 1719 03/27/20 0336 03/28/20 0258 03/29/20 0355 03/30/20 0308  WBC 4.1  --   --  6.3 6.9 6.4 9.0  HGB 6.1*   < > 11.2* 10.0* 10.2* 10.0* 9.7*  HCT 20.8*   < > 36.2 31.3* 33.1* 32.4* 30.8*  MCV 86.0  --   --  84.1 87.1 86.6 85.1  PLT 193  --   --  190 213 222 199   < > = values in this interval not displayed.   Basic Metabolic Panel: Recent Labs  Lab 03/26/20 0649 03/27/20 0336 03/28/20 0258 03/29/20 0355 03/30/20 0308  NA 141 142 139 137 139  K 3.0* 3.2* 4.0 3.3* 4.0  CL 109 107 107 102 105  CO2 25 26 25 25 26   GLUCOSE 121* 99 91 106*  106*  BUN 12 6* 14 19 15   CREATININE 0.93 0.77 0.94 1.02* 0.84  CALCIUM 8.2* 8.8* 8.7* 8.5* 8.1*   GFR: Estimated Creatinine Clearance: 45.1 mL/min (by C-G formula based on SCr of 0.84 mg/dL). Liver Function Tests: Recent Labs  Lab 03/25/20 1938  AST 23  ALT 16  ALKPHOS 59  BILITOT 0.5  PROT 6.9  ALBUMIN 3.5   No results for input(s): LIPASE, AMYLASE in the last 168 hours. No results for input(s): AMMONIA in the last 168 hours. Coagulation Profile: No results for input(s): INR, PROTIME in the last 168 hours. Cardiac Enzymes: No results for input(s): CKTOTAL, CKMB, CKMBINDEX, TROPONINI in the last 168  hours. BNP (last 3 results) No results for input(s): PROBNP in the last 8760 hours. HbA1C: No results for input(s): HGBA1C in the last 72 hours. CBG: Recent Labs  Lab 03/29/20 0810 03/29/20 1709 03/29/20 2157 03/30/20 0745 03/30/20 1542  GLUCAP 101* 125* 132* 85 109*   Lipid Profile: No results for input(s): CHOL, HDL, LDLCALC, TRIG, CHOLHDL, LDLDIRECT in the last 72 hours. Thyroid Function Tests: No results for input(s): TSH, T4TOTAL, FREET4, T3FREE, THYROIDAB in the last 72 hours. Anemia Panel: No results for input(s): VITAMINB12, FOLATE, FERRITIN, TIBC, IRON, RETICCTPCT in the last 72 hours. Sepsis Labs: No results for input(s): PROCALCITON, LATICACIDVEN in the last 168 hours.  Recent Results (from the past 240 hour(s))  SARS Coronavirus 2 by RT PCR (hospital order, performed in [facility] hospital lab) Nasopharyngeal Nasopharyngeal Swab     Status: None   Collection Time: 03/26/20  2:30 AM   Specimen: Nasopharyngeal Swab  Result Value Ref Range Status   SARS Coronavirus 2 NEGATIVE NEGATIVE Final    Comment: (NOTE) SARS-CoV-2 target nucleic acids are NOT DETECTED.  The SARS-CoV-2 RNA is generally detectable in upper and lower respiratory specimens during the acute phase of infection. The lowest concentration of SARS-CoV-2 viral copies this assay can detect is 250 copies / mL. A negative result does not preclude SARS-CoV-2 infection and should not be used as the sole basis for treatment or other patient management decisions.  A negative result may occur with improper specimen collection / handling, submission of specimen other than nasopharyngeal swab, presence of viral mutation(s) within the areas targeted by this assay, and inadequate number of viral copies (<250 copies / mL). A negative result must be combined with clinical observations, patient history, and epidemiological information.  Fact Sheet for Patients:   StrictlyIdeas.no  Fact  Sheet for Healthcare Providers: BankingDealers.co.za  This test is not yet approved or  cleared by the [place] FDA and has been authorized for detection and/or diagnosis of SARS-CoV-2 by FDA under an Emergency Use Authorization (EUA).  This EUA will remain in effect (meaning this test can be used) for the duration of the COVID-19 declaration under Section 564(b)(1) of the Act, 21 U.S.C. section 360bbb-3(b)(1), unless the authorization is terminated or revoked sooner.  Performed at [facility], [place] [address], [place], [place] [address]   Surgical pcr screen     Status: None   Collection Time: 03/29/20  6:04 AM   Specimen: Nasal Mucosa; Nasal Swab  Result Value Ref Range Status   MRSA, PCR NEGATIVE NEGATIVE Final   Staphylococcus aureus NEGATIVE NEGATIVE Final    Comment: (NOTE) The Xpert SA Assay (FDA approved for NASAL specimens in patients 42 years of age and older), is one component of a comprehensive surveillance program. It is not intended to diagnose infection nor  to guide or monitor treatment. Performed at [facility], [place] [address], [place], [place] [address]       Radiology Studies: [facility] Chest Port 1 View  Result Date: 03/29/2020 CLINICAL DATA:  Preoperative respiratory evaluation. EXAM: PORTABLE CHEST 1 VIEW COMPARISON:  None. FINDINGS: 0527 hours. The lungs are clear without focal pneumonia, edema, pneumothorax or pleural effusion. The cardio pericardial silhouette is enlarged. The visualized bony structures of the thorax show now acute abnormality. IMPRESSION: No active disease. Electronically Signed   By: Misty Stanley M.D.   On: 03/29/2020 07:40      Scheduled Meds: . sodium chloride   Intravenous Once  . amLODipine  5 mg Oral Daily  . vitamin C  250 mg Oral Daily  . enoxaparin (LOVENOX) injection  40 mg Subcutaneous Q24H  . feeding supplement (ENSURE ENLIVE)  237 mL Oral BID BM  . fluticasone   1 spray Each Nare Daily  . metoprolol tartrate  100 mg Oral BID  . multivitamin with minerals  1 tablet Oral Daily  . pantoprazole  40 mg Oral BID AC   Continuous Infusions: . sodium chloride Stopped (03/26/20 1639)     Assessment/Plan:  1.  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  General surgery consulted per GI recommendations and patient underwent Laparoscopic resection of gastric tumor 8/30.  Tolerated procedure well.  Hemoglobin stable around 10->9.7 today. Noted orders for Full liquid diet today, can advance to solids in am and possibly d/c home if cleared by GS. Ct chest/abdomen pelvis for metastatic w/u showed a pulmonary nodule and non specific uterine/adnexal masses. Oncology referral once intraop path report confirms malignancy.   2.  Hypertension: On Norvasc, metoprolol.  Watch for hypotension.  3.  AKI: Present on admission with creatinine at 1.4.  Now improved to 0.9 with fluid resuscitation.  4.  Hypokalemia: Replaced but low again this morning.  Will replace IV.  Labs in a.m.  5. Uterine masses: Per radiology report suggestive of leiomyoma/fibroids. Needs Gyn f/u arranged prior to discharge. See GI note.   6. Pulmonary nodule: Noted to have 7 x 6 mm left lower lobe nodule on CT chest , close attention as outpatient recommended especially in context of above.   DVT prophylaxis: SCDs Code Status: Full code Family / Patient Communication: Discussed with patient Disposition Plan:   Status is: Inpatient  Remains inpatient appropriate because:Inpatient level of care appropriate due to severity of illness   Dispo: The patient is from: Home              Anticipated d/c is to: TBD               Anticipated d/c date is: 2 days              Patient currently is not medically stable to d/c.   Time spent: 25 minutes     >50% time spent in discussions with care team and coordination of care.    Guilford Shi, MD [facility] Pager in [place]  If 7PM-7AM, please contact night-coverage [email or web address] 03/30/2020, 4:54 PM

## 2020-03-30 NOTE — Evaluation (Signed)
Physical Therapy Evaluation Patient Details Name: Lori Rush MRN: [id number] DOB: [birth year] Today's Date: 03/30/2020   History of Present Illness  74 yo female s/p lap wedge gastrectomy 8/30. Hx of CKD, anemia.  Clinical Impression  On eval, pt was Min guard for mobility. She walked ~175 feet around unit with a rollator and ~15 feet without a device. Mildly unsteady with slow gait speed. Discussed d/c plan-pt stated her sister is able to assist as needed. She agrees that she doesn't need a walker for home. Recommend daily ambulation in the hallway with nursing supervision/assistance. Will plan to follow pt during hospital stay.     Follow Up Recommendations Supervision for mobility/OOB    Equipment Recommendations  None recommended by PT    Recommendations for Other Services       Precautions / Restrictions Precautions Precautions: Fall Restrictions Weight Bearing Restrictions: No      Mobility  Bed Mobility Overal bed mobility: Modified Independent                Transfers Overall transfer level: Needs assistance   Transfers: Sit to/from Stand Sit to Stand: Supervision         General transfer comment: for safety, hand placement  Ambulation/Gait Ambulation/Gait assistance: Min guard Gait Distance (Feet): 175 Feet Assistive device: 4-wheeled walker Gait Pattern/deviations: Step-through pattern;Decreased stride length     General Gait Details: Pt walked ~175 feet with rollator and ~15 feet without a device. Min guard for safety. Slow gait speed.  Stairs            Wheelchair Mobility    Modified Rankin (Stroke Patients Only)       Balance Overall balance assessment: Needs assistance         Standing balance support: No upper extremity supported Standing balance-Leahy Scale: Fair                               Pertinent Vitals/Pain Pain Assessment: Faces Faces Pain Scale: Hurts little more Pain Location: abdomen Pain  Descriptors / Indicators: Discomfort;Sore Pain Intervention(s): Monitored during session    Home Living Family/patient expects to be discharged to:: Private residence Living Arrangements: Other relatives Available Help at Discharge: Family Type of Home: House       Home Layout: Two level;Bed/bath upstairs Home Equipment: None      Prior Function Level of Independence: Independent               Hand Dominance        Extremity/Trunk Assessment   Upper Extremity Assessment Upper Extremity Assessment: Overall WFL for tasks assessed    Lower Extremity Assessment Lower Extremity Assessment: Generalized weakness    Cervical / Trunk Assessment Cervical / Trunk Assessment: Normal  Communication   Communication: No difficulties  Cognition Arousal/Alertness: Awake/alert Behavior During Therapy: WFL for tasks assessed/performed Overall Cognitive Status: Within Functional Limits for tasks assessed                                        General Comments      Exercises     Assessment/Plan    PT Assessment Patient needs continued PT services  PT Problem List Decreased strength;Decreased mobility;Decreased activity tolerance;Decreased balance;Pain       PT Treatment Interventions DME instruction;Gait training;Therapeutic activities;Therapeutic exercise;Patient/family education;Balance training;Functional mobility training    PT Goals (  Current goals can be found in the Care Plan section)  Acute Rehab PT Goals Patient Stated Goal: home PT Goal Formulation: With patient Time For Goal Achievement: 04/07/20 Potential to Achieve Goals: Good    Frequency Min 3X/week   Barriers to discharge        Co-evaluation               AM-PAC PT "6 Clicks" Mobility  Outcome Measure Help needed turning from your back to your side while in a flat bed without using bedrails?: None Help needed moving from lying on your back to sitting on the side of a  flat bed without using bedrails?: None Help needed moving to and from a bed to a chair (including a wheelchair)?: A Little Help needed standing up from a chair using your arms (e.g., wheelchair or bedside chair)?: A Little Help needed to walk in hospital room?: A Little Help needed climbing 3-5 steps with a railing? : A Little 6 Click Score: 20    End of Session   Activity Tolerance: Patient tolerated treatment well Patient left: in chair;with call bell/phone within reach;with chair alarm set   PT Visit Diagnosis: Pain;Unsteadiness on feet (R26.81) Pain - part of body:  (abdomen)    Time: 9326-7124 PT Time Calculation (min) (ACUTE ONLY): 19 min   Charges:   PT Evaluation $PT Eval Low Complexity: [place], PT Acute Rehabilitation  Office: [phone] Pager: [phone]

## 2020-03-30 NOTE — Plan of Care (Signed)
?  Problem: Clinical Measurements: ?Goal: Will remain free from infection ?Outcome: Progressing ?  ?Problem: Nutrition: ?Goal: Adequate nutrition will be maintained ?Outcome: Progressing ?  ?Problem: Pain Managment: ?Goal: General experience of comfort will improve ?Outcome: Progressing ?  ?

## 2020-03-30 NOTE — Progress Notes (Signed)
[facility] Progress Note  1 Day Post-Op  Subjective: Patient reports some mild abdominal soreness this AM but overall feeling well. Tolerating CLD. +flatus. Has not been up and ambulating yet but would like to today.   Objective: Vital signs in last 24 hours: Temp:  [97.3 F (36.3 C)-98.5 F (36.9 C)] 98.3 F (36.8 C) (08/31 0558) Pulse Rate:  [46-57] 57 (08/31 0558) Resp:  [14-20] 18 (08/31 0558) BP: (130-174)/(67-94) 134/76 (08/31 0558) SpO2:  [99 %-100 %] 99 % (08/31 0558) Weight:  [58.7 kg] 58.7 kg (08/30 0828) Last BM Date: 03/27/20  Intake/Output from previous day: 08/30 0701 - 08/31 0700 In: 2047 [P.O.:360; I.V.:1487; IV Piggyback:200] Out: 1975 [ZOXWR:6045; Blood:50] Intake/Output this shift: No intake/output data recorded.  PE: General: pleasant, WD, [name] who is laying in bed in NAD Heart: regular, rate, and rhythm.  Normal s1,s2. No obvious murmurs, gallops, or rubs noted.  Palpable radial and pedal pulses bilaterally Lungs: CTAB, no wheezes, rhonchi, or rales noted.  Respiratory effort nonlabored Abd: soft, appropriately ttp, ND, +BS, incisions with clean surgical dressings present  MS: all 4 extremities are symmetrical with no cyanosis, clubbing, or edema. Psych: A&Ox3 with an appropriate affect.   Lab Results:  Recent Labs    03/29/20 0355 03/30/20 0308  WBC 6.4 9.0  HGB 10.0* 9.7*  HCT 32.4* 30.8*  PLT 222 199   BMET Recent Labs    03/29/20 0355 03/30/20 0308  NA 137 139  K 3.3* 4.0  CL 102 105  CO2 25 26  GLUCOSE 106* 106*  BUN 19 15  CREATININE 1.02* 0.84  CALCIUM 8.5* 8.1*   PT/INR No results for input(s): LABPROT, INR in the last 72 hours. CMP     Component Value Date/Time   NA 139 03/30/2020 0308   K 4.0 03/30/2020 0308   CL 105 03/30/2020 0308   CO2 26 03/30/2020 0308   GLUCOSE 106 (H) 03/30/2020 0308   BUN 15 03/30/2020 0308   CREATININE 0.84 03/30/2020 0308   CALCIUM 8.1 (L) 03/30/2020 0308   PROT 6.9  03/25/2020 1938   ALBUMIN 3.5 03/25/2020 1938   AST 23 03/25/2020 1938   ALT 16 03/25/2020 1938   ALKPHOS 59 03/25/2020 1938   BILITOT 0.5 03/25/2020 1938   GFRNONAA >60 03/30/2020 0308   GFRAA >60 03/30/2020 0308   Lipase  No results found for: LIPASE     Studies/Results: DG Chest Port 1 View  Result Date: 03/29/2020 CLINICAL DATA:  Preoperative respiratory evaluation. EXAM: PORTABLE CHEST 1 VIEW COMPARISON:  None. FINDINGS: 0527 hours. The lungs are clear without focal pneumonia, edema, pneumothorax or pleural effusion. The cardio pericardial silhouette is enlarged. The visualized bony structures of the thorax show now acute abnormality. IMPRESSION: No active disease. Electronically Signed   By: Lori Rush M.D.   On: 03/29/2020 07:40    Anti-infectives: Anti-infectives (From admission, onward)   Start     Dose/Rate Route Frequency Ordered Stop   03/29/20 1200  cefoTEtan (CEFOTAN) 2 g in sodium chloride 0.9 % 100 mL IVPB        2 g 200 mL/hr over 30 Minutes Intravenous On call to O.R. 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 Kieth Rush - POD#1 - pain well controlled - tolerating CLD - advance  to FLD - d/c foley - mobilize today - hgb 9.7 from 10 - ok to start lovenox today   - surgical path pending   FEN: FLD VTE: SCDs, lovenox ID: [email or web address] pre-op Follow up: Lori Rush    LOS: 4 days    Lori Rush , [facility] 03/30/2020, 7:35 AM Please see Amion for pager number during day hours 7:00am-4:30pm

## 2020-03-30 NOTE — Telephone Encounter (Signed)
Please see if one of the [facility] health gynecology practices accepts Medicaid.  If not, please ask them where [facility] gynecology consults should be sent because I do not know.

## 2020-03-30 NOTE — Plan of Care (Signed)
Plan of care reviewed and discussed with the patient. 

## 2020-03-30 NOTE — Telephone Encounter (Addendum)
Yes, thanks, I saw the CT while on call this past weekend and then called the surgical consult for the gastric mass.  However, there are pelvic findings requiring gynecologic follow-up.  Please send a referral to [facility] to arrange post-hospital follow-up for those findings.  - HD

## 2020-03-30 NOTE — Progress Notes (Signed)
The patient refused her second dose of K+ d/t the discomfort/burning of the first dose.

## 2020-03-30 NOTE — Telephone Encounter (Signed)
Pt has medicaid, [facility] does not accept medicaid. Please advise regarding referral.

## 2020-03-30 NOTE — Discharge Instructions (Signed)
[facility] LAP[facility]: POST OP INSTRUCTIONS Always review your discharge instruction sheet given to you by the facility where your surgery was performed. IF YOU HAVE DISABILITY OR FAMILY LEAVE FORMS, YOU MUST BRING THEM TO THE OFFICE FOR PROCESSING.   DO NOT GIVE THEM TO YOUR DOCTOR.  PAIN CONTROL  1. First take acetaminophen (Tylenol) control your pain after surgery.  Follow directions on package.  Taking acetaminophen (Tylenol) regularly after surgery will help to control your pain and lower the amount of prescription pain medication you may need.  You should not take more than 3,000 mg (3 grams) of acetaminophen (Tylenol) in 24 hours.  You should not take ibuprofen (Advil), aleve, motrin, naprosyn or other NSAIDS if you have a history of stomach ulcers or chronic kidney disease.  2. A prescription for pain medication may be given to you upon discharge.  Take your pain medication as prescribed, if you still have uncontrolled pain after taking acetaminophen (Tylenol). 3. Use ice packs to help control pain. 4. If you need a refill on your pain medication, please contact your pharmacy.  They will contact our office to request authorization. Prescriptions will not be filled after 5pm or on week-ends.  HOME MEDICATIONS 5. Take your usually prescribed medications unless otherwise directed.  DIET 6. You should follow a light diet the first few days after arrival home.  Be sure to include lots of fluids daily. Avoid fatty, fried foods.   CONSTIPATION 7. It is common to experience some constipation after surgery and if you are taking pain medication.  Increasing fluid intake and taking a stool softener (such as Colace) will usually help or prevent this problem from occurring.  A mild laxative (Milk of Magnesia or Miralax) should be taken according to package instructions if there are no bowel movements after 48 hours.  WOUND/INCISION CARE 8. Most patients will  experience some swelling and bruising in the area of the incisions.  Ice packs will help.  Swelling and bruising can take several days to resolve.  9. Unless discharge instructions indicate otherwise, follow guidelines below  a. STERI-STRIPS - you may remove your outer bandages 48 hours after surgery, and you may shower at that time.  You have steri-strips (small skin tapes) in place directly over the incision.  These strips should be left on the skin for 7-10 days.   b. DERMABOND/SKIN GLUE - you may shower in 24 hours.  The glue will flake off over the next 2-3 weeks. 10. Any sutures or staples will be removed at the office during your follow-up visit.  ACTIVITIES 11. You may resume regular (light) daily activities beginning the next day--such as daily self-care, walking, climbing stairs--gradually increasing activities as tolerated.  You may have sexual intercourse when it is comfortable.  Refrain from any heavy lifting or straining until approved by your doctor. a. You may drive when you are no longer taking prescription pain medication, you can comfortably wear a seatbelt, and you can safely maneuver your car and apply brakes.  FOLLOW-UP 12. You should see your doctor in the office for a follow-up appointment approximately 2-3 weeks after your surgery.  You should have been given your post-op/follow-up appointment when your surgery was scheduled.  If you did not receive a post-op/follow-up appointment, make sure that you call for this appointment within a day or two after you arrive home to insure a convenient appointment time.   WHEN TO CALL YOUR DOCTOR: 1. Fever over 101.0 2.  Inability to urinate 3. Continued bleeding from incision. 4. Increased pain, redness, or drainage from the incision. 5. Increasing abdominal pain  The clinic staff is available to answer your questions during regular business hours.  Please don't hesitate to call and ask to speak to one of the nurses for clinical  concerns.  If you have a medical emergency, go to the nearest emergency room or call 911.  A surgeon from [facility] is always on call at the hospital. [address] [place], [place], [place]  [address] ? [address], [place], [place]   [address] [phone] ? [phone] ? FAX [phone] Web site: [email or web address]

## 2020-03-31 LAB — BASIC METABOLIC PANEL
Anion gap: 7 (ref 5–15)
BUN: 13 mg/dL (ref 8–23)
CO2: 30 mmol/L (ref 22–32)
Calcium: 8.4 mg/dL — ABNORMAL LOW (ref 8.9–10.3)
Chloride: 101 mmol/L (ref 98–111)
Creatinine, Ser: 0.85 mg/dL (ref 0.44–1.00)
GFR calc Af Amer: >60 mL/min (ref >60)
GFR calc non Af Amer: >60 mL/min (ref >60)
Glucose, Bld: 111 mg/dL — ABNORMAL HIGH (ref 70–99)
Potassium: 3.5 mmol/L (ref 3.5–5.1)
Sodium: 138 mmol/L (ref 135–145)

## 2020-03-31 LAB — CBC
HCT: 30.7 % — ABNORMAL LOW (ref 36.0–46.0)
Hemoglobin: 9.5 g/dL — ABNORMAL LOW (ref 12.0–15.0)
MCH: 26.8 pg (ref 26.0–34.0)
MCHC: 30.9 g/dL (ref 30.0–36.0)
MCV: 86.5 fL (ref 80.0–100.0)
Platelets: 194 10*3/uL (ref 150–400)
RBC: 3.55 MIL/uL — ABNORMAL LOW (ref 3.87–5.11)
RDW: 17.4 % — ABNORMAL HIGH (ref 11.5–15.5)
WBC: 6.7 10*3/uL (ref 4.0–10.5)
nRBC: 0 % (ref 0.0–0.2)

## 2020-03-31 LAB — GLUCOSE, CAPILLARY
Glucose-Capillary: 125 mg/dL — ABNORMAL HIGH (ref 70–99)
Glucose-Capillary: 127 mg/dL — ABNORMAL HIGH (ref 70–99)
Glucose-Capillary: 96 mg/dL (ref 70–99)

## 2020-03-31 MED ORDER — POLYETHYLENE GLYCOL 3350 17 G PO PACK
17.0000 g | PACK | Freq: Every day | ORAL | Status: DC
  Administered 2020-03-31 – 2020-04-01 (×2): 17 g via ORAL
  Filled 2020-03-31 (×2): qty 1

## 2020-03-31 MED ORDER — DOCUSATE SODIUM 100 MG PO CAPS
100.0000 mg | ORAL_CAPSULE | Freq: Two times a day (BID) | ORAL | Status: DC
  Administered 2020-03-31 – 2020-04-01 (×3): 100 mg via ORAL
  Filled 2020-03-31 (×3): qty 1

## 2020-03-31 NOTE — Progress Notes (Signed)
PROGRESS NOTE    Lori Rush   SHF:[id number]  DOB: Jul 01, 1946  DOA: 03/26/2020     5  PCP: Glendon Axe, MD  CC: anemia  Hospital Course: Lori Rush is a 74 year old female with history of hypertension, CKD stage II, iron deficiency anemia, was admitted for symptomatic anemia in 12/2019.  At that time patient had undergone capsule endoscopy which did not show any reason for anemia.  Patient was seen at PCPs office on 8/26 and was informed that her hemoglobin was very low and recommended ER evaluation and blood transfusion.  Patient also reported she had melanotic stools for last several weeks otherwise no abdominal pain.  She also reported increasing fatigue, generalized weakness, no chest pain or shortness of breath. GI was consulted on admission. She received 2 units of PRBC transfusion on 8/27 and hemoglobin improved to 10.   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 She then underwent laparoscopic wedge gastrectomy of lesser curvature on 03/29/2020. Her diet was slowly advanced after surgery.  Of note, she had undergone CT chest/abd/pelvis on 03/26/20 as well. Other notable findings to follow up on at discharge include: 1) Small pulmonary nodule in the LEFT lung base 7 x 6 mm. 2) Bulky uterus with multiple enhancing masses and with a calcified fibroid along the RIGHT uterus. One of these exophytic heterogeneous masses is dumbbell-shaped and extends towards the LEFT adnexa. Area is not well characterized on CT some of these measuring as large as 4.3 x 4.5 cm. The patient is on medicaid and [facility] was found to accept her [place] for referral purposes at dischage.    Interval History:  Resting in bed quietly and comfortably this am. Endoses flatus but no BM. Tolerating liquid diet and is planned to advance to soft diet for lunch. Reviewed the findings of her CT with her and discussed outpatient  follow up with specialists regarding next steps.   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  General surgery consulted per GI recommendations and patient underwent Laparoscopic resection of gastric tumor 8/30.  Tolerated procedure well.  Hemoglobin stable around 10 - diet being advanced to soft diet today - will need referral to oncology and GYN outpatient  Hypertension: On Norvasc, metoprolol.  Watch for hypotension.  AKI: Present on admission with creatinine at 1.4.  Resolved with IVF  Hypokalemia:  Replete and recheck as needed  Uterine masses: Possibly fibroids versus metastasis.  Follow-up pathology results -Outpatient follow-up with GYN  Pulmonary nodule: Noted to have 7 x 6 mm left lower lobe nodule on CT chest , close attention as outpatient recommended especially in context of above.    DVT prophylaxis: Lovenox Code Status: Full Family Communication: none Disposition Plan: Status is: Inpatient  Remains inpatient appropriate because:Unsafe d/c plan, IV treatments appropriate due to intensity of illness or inability to take PO and Inpatient level of care appropriate due to severity of illness   Dispo: The patient is from: Home  Anticipated d/c is to: Home              Anticipated d/c date is: 1 day              Patient currently is not medically stable to d/c.       Objective: Blood pressure (!) 157/70, pulse (!) 56,  temperature 98.3 F (36.8 C), temperature source Oral, resp. rate 18, height 5\' 2"  (1.575 m), weight 58.7 kg, SpO2 99 %.  Examination: General appearance: alert, cooperative, no distress and soft spoken Head: Normocephalic, without obvious abnormality, atraumatic Eyes: EOMI Lungs: clear to auscultation bilaterally Heart: regular rate and rhythm and S1, S2 normal Abdomen: TTP over right quadrants notably at surgical sites; BS present; soft, ND; lap surgical sites noted with steristrips in place and no surrounding signs of infection Extremities: no edema Skin: mobility and turgor normal Neurologic: Grossly normal  Consultants:   Surgery  GI  Data Reviewed: I have personally reviewed following labs and imaging studies Results for orders placed or performed during the hospital encounter of 03/26/20 (from the past 24 hour(s))  Glucose, capillary     Status: Abnormal   Collection Time: 03/30/20  3:42 PM  Result Value Ref Range   Glucose-Capillary 109 (H) 70 - 99 mg/dL  Glucose, capillary     Status: Abnormal   Collection Time: 03/31/20 12:17 AM  Result Value Ref Range   Glucose-Capillary 127 (H) 70 - 99 mg/dL  CBC     Status: Abnormal   Collection Time: 03/31/20  3:01 AM  Result Value Ref Range   WBC 6.7 4.0 - 10.5 K/uL   RBC 3.55 (L) 3.87 - 5.11 MIL/uL   Hemoglobin 9.5 (L) 12.0 - 15.0 g/dL   HCT 30.7 (L) 36 - 46 %   MCV 86.5 80.0 - 100.0 fL   MCH 26.8 26.0 - 34.0 pg   MCHC 30.9 30.0 - 36.0 g/dL   RDW 17.4 (H) 11.5 - 15.5 %   Platelets 194 150 - 400 K/uL   nRBC 0.0 0.0 - 0.2 %  Basic metabolic panel     Status: Abnormal   Collection Time: 03/31/20  3:01 AM  Result Value Ref Range   Sodium 138 135 - 145 mmol/L   Potassium 3.5 3.5 - 5.1 mmol/L   Chloride 101 98 - 111 mmol/L   CO2 30 22 - 32 mmol/L   Glucose, Bld 111 (H) 70 - 99 mg/dL   BUN 13 8 - 23 mg/dL   Creatinine, Ser 0.85 0.44 - 1.00 mg/dL   Calcium 8.4 (L) 8.9 - 10.3 mg/dL   GFR calc non Af Amer >60 >60 mL/min    GFR calc Af Amer >60 >60 mL/min   Anion gap 7 5 - 15  Glucose, capillary     Status: None   Collection Time: 03/31/20  8:29 AM  Result Value Ref Range   Glucose-Capillary 96 70 - 99 mg/dL    Recent Results (from the past 240 hour(s))  SARS Coronavirus 2 by RT PCR (hospital order, performed in [place] hospital lab) Nasopharyngeal Nasopharyngeal Swab     Status: None   Collection Time: 03/26/20  2:30 AM   Specimen: Nasopharyngeal Swab  Result Value Ref Range Status   SARS Coronavirus 2 NEGATIVE NEGATIVE Final    Comment: (NOTE) SARS-CoV-2 target nucleic acids are NOT DETECTED.  The SARS-CoV-2 RNA is generally detectable in upper and lower respiratory specimens during the acute phase of infection. The lowest concentration of  SARS-CoV-2 viral copies this assay can detect is 250 copies / mL. A negative result does not preclude SARS-CoV-2 infection and should not be used as the sole basis for treatment or other patient management decisions.  A negative result may occur with improper specimen collection / handling, submission of specimen other than nasopharyngeal swab, presence of viral mutation(s) within the areas targeted by this assay, and inadequate number of viral copies (<250 copies / mL). A negative result must be combined with clinical observations, patient history, and epidemiological information.  Fact Sheet for Patients:   StrictlyIdeas.no  Fact Sheet for Healthcare Providers: BankingDealers.co.za  This test is not yet approved or  cleared by the [place] FDA and has been authorized for detection and/or diagnosis of SARS-CoV-2 by FDA under an Emergency Use Authorization (EUA).  This EUA will remain in effect (meaning this test can be used) for the duration of the COVID-19 declaration under Section 564(b)(1) of the Act, 21 U.S.C. section 360bbb-3(b)(1), unless the authorization is terminated or revoked sooner.   Performed at [facility], [place] [address], [place], [place] [address]   Surgical pcr screen     Status: None   Collection Time: 03/29/20  6:04 AM   Specimen: Nasal Mucosa; Nasal Swab  Result Value Ref Range Status   MRSA, PCR NEGATIVE NEGATIVE Final   Staphylococcus aureus NEGATIVE NEGATIVE Final    Comment: (NOTE) The Xpert SA Assay (FDA approved for NASAL specimens in patients 2 years of age and older), is one component of a comprehensive surveillance program. It is not intended to diagnose infection nor to guide or monitor treatment. Performed at [facility], [place] [address], [place], [place] [address]      Radiology Studies: No results found. DG Chest Port 1 View  Final Result    CT CHEST W CONTRAST  Final Result    CT ABDOMEN PELVIS W CONTRAST  Final Result      Scheduled Meds: . sodium chloride   Intravenous Once  . amLODipine  5 mg Oral Daily  . vitamin C  250 mg Oral Daily  . docusate sodium  100 mg Oral BID  . enoxaparin (LOVENOX) injection  40 mg Subcutaneous Q24H  . feeding supplement (ENSURE ENLIVE)  237 mL Oral BID BM  . fluticasone  1 spray Each Nare Daily  . metoprolol tartrate  100 mg Oral BID  . multivitamin with minerals  1 tablet Oral Daily  . pantoprazole  40 mg Oral BID AC  . polyethylene glycol  17 g Oral Daily   PRN Meds: sodium chloride, acetaminophen, diphenhydrAMINE, hydrALAZINE, morphine injection, ondansetron **OR** ondansetron (ZOFRAN) IV, oxyCODONE Continuous Infusions: . sodium chloride Stopped (03/26/20 1639)      LOS: 5 days  Time spent: Greater than 50% of the 35 minute visit was spent in counseling/coordination of care for the patient as laid out in the A&P.   Dwyane Dee, MD [facility] 03/31/2020, 1:48 PM  Contact via secure chat.  To contact the attending provider between 7A-7P or the covering provider during after hours 7P-7A, please log into the web site [email or web address] and  access using universal Laura password for that web site. If you do not have the password, please call the hospital operator.

## 2020-03-31 NOTE — Progress Notes (Signed)
[facility] Progress Note  2 Days Post-Op  Subjective: Patient reports a little more pain/soreness. Tolerating FLD. +flatus, but no BM yet.   Objective: Vital signs in last 24 hours: Temp:  [97.9 F (36.6 C)-98 F (36.7 C)] 98 F (36.7 C) (09/01 0607) Pulse Rate:  [55-57] 55 (09/01 0607) Resp:  [18] 18 (09/01 0607) BP: (127-156)/(65-73) 127/73 (09/01 0607) SpO2:  [96 %-98 %] 98 % (09/01 0607) Last BM Date: 03/30/20  Intake/Output from previous day: 08/31 0701 - 09/01 0700 In: 240 [P.O.:240] Out: 450 [Urine:450] Intake/Output this shift: No intake/output data recorded.  PE: General: pleasant, WD, [name] who is laying in bed in NAD Heart: regular, rate, and rhythm.  Normal s1,s2. No obvious murmurs, gallops, or rubs noted.  Palpable radial and pedal pulses bilaterally Lungs: CTAB, no wheezes, rhonchi, or rales noted.  Respiratory effort nonlabored Abd: soft, appropriately ttp, ND, +BS, incisions with clean surgical dressings present  MS: all 4 extremities are symmetrical with no cyanosis, clubbing, or edema. Psych: A&Ox3 with an appropriate affect.   Lab Results:  Recent Labs    03/30/20 0308 03/31/20 0301  WBC 9.0 6.7  HGB 9.7* 9.5*  HCT 30.8* 30.7*  PLT 199 194   BMET Recent Labs    03/30/20 0308 03/31/20 0301  NA 139 138  K 4.0 3.5  CL 105 101  CO2 26 30  GLUCOSE 106* 111*  BUN 15 13  CREATININE 0.84 0.85  CALCIUM 8.1* 8.4*   PT/INR No results for input(s): LABPROT, INR in the last 72 hours. CMP     Component Value Date/Time   NA 138 03/31/2020 0301   K 3.5 03/31/2020 0301   CL 101 03/31/2020 0301   CO2 30 03/31/2020 0301   GLUCOSE 111 (H) 03/31/2020 0301   BUN 13 03/31/2020 0301   CREATININE 0.85 03/31/2020 0301   CALCIUM 8.4 (L) 03/31/2020 0301   PROT 6.9 03/25/2020 1938   ALBUMIN 3.5 03/25/2020 1938   AST 23 03/25/2020 1938   ALT 16 03/25/2020 1938   ALKPHOS 59 03/25/2020 1938   BILITOT 0.5 03/25/2020 1938   GFRNONAA  >60 03/31/2020 0301   GFRAA >60 03/31/2020 0301   Lipase  No results found for: LIPASE     Studies/Results: No results found.  Anti-infectives: Anti-infectives (From admission, onward)   Start     Dose/Rate Route Frequency Ordered Stop   03/29/20 1200  cefoTEtan (CEFOTAN) 2 g in sodium chloride 0.9 % 100 mL IVPB        2 g 200 mL/hr over 30 Minutes Intravenous On call to O.R. 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 Kieth Brightly - POD#2 - pain: tylenol q6h prn, oxy ir q4h prn  - tolerating FLD - advance to soft diet today  - bowel regimen - cont to mobilize - hgb 9.5 from 9.7, stable  - surgical path pending  - may be ready for discharge home tomorrow from a surgery perspective if tolerating soft diet   FEN: soft diet  VTE: SCDs, lovenox ID: [email or web address] pre-op Follow up: [place]   LOS: 5 days    Norm Parcel , [facility] 03/31/2020, 7:45 AM Please see Amion for pager number during day hours 7:00am-4:30pm

## 2020-03-31 NOTE — Progress Notes (Signed)
Patient laying in bed in stable condition.  Now stating a pain level of 4 on 0-10 pain scale.  Given PRN pain med approx 2 hours ago.  Pain level was 7 on 0-10 pain scale at that time.  Patient states pain only goes back up to 7 when she walks but no longer while lying. Visit from MD at approx 12pm.  MD did not mention changes to orders at that time.  Patient up to bathroom with minimal assistance at 12:05, only urine.  Patient states she has passed gas.  Right abdominal area remains tender to touch.  Surgical site is right area.  Incision sites clean, dry, and intact with adhesive strips in place.  Patient is now ordering lunch.  Call bell and phone within reach.  Bed at lowest position.

## 2020-03-31 NOTE — TOC Initial Note (Signed)
Transition of Care [facility]) - Initial/Assessment Note    Patient Details  Name: Lori Rush MRN: [id number] Date of Birth: 03-13-1946  [facility]) CM/SW Contact:    Lia Hopping, [place] Phone Number: 03/31/2020, 12:21 PM  Clinical Narrative:                 CSW met with the patient to determine any home needs. Patient lives in a single family home with her sister. The patient reports she is independent with her ADL's. Patient reports she is ambulatory without a RW. Patient declines needing a RW at home. Patient reports her sister will be able to provide supervision upon her return home.   Per physician request: OBGYN that accepts patient insurance plan [facility] will accept her [facility] plan. [phone]  No other needs identified. CSW will sign off.     Expected Discharge Plan: Home/Self Care Barriers to Discharge: Continued Medical Work up   Patient Goals and CMS Choice     Choice offered to / list presented to : NA  Expected Discharge Plan and Services Expected Discharge Plan: Home/Self Care       Living arrangements for the past 2 months: Single Family Home                 DME Arranged: N/A DME Agency: NA       HH Arranged: NA HH Agency: NA        Prior Living Arrangements/Services Living arrangements for the past 2 months: Single Family Home Lives with:: Siblings Patient language and need for interpreter reviewed:: No Do you feel safe going back to the place where you live?: Yes               Activities of Daily Living Home Assistive Devices/Equipment: None ADL Screening (condition at time of admission) Patient's cognitive ability adequate to safely complete daily activities?: Yes Is the patient deaf or have difficulty hearing?: No Does the patient have difficulty seeing, even when wearing glasses/contacts?: No Does the patient have difficulty concentrating, remembering, or making decisions?: No Patient able to express need  for assistance with ADLs?: Yes Does the patient have difficulty dressing or bathing?: No Independently performs ADLs?: Yes (appropriate for developmental age) Does the patient have difficulty walking or climbing stairs?: No Weakness of Legs: None Weakness of Arms/Hands: None  Permission Sought/Granted   Permission granted to share information with : Yes, Verbal Permission Granted              Emotional Assessment Appearance:: Appears stated age Attitude/Demeanor/Rapport: Engaged Affect (typically observed): Accepting Orientation: : Oriented to Self, Oriented to Place, Oriented to  Time, Oriented to Situation Alcohol / Substance Use: Not Applicable Psych Involvement: No (comment)  Admission diagnosis:  Symptomatic anemia [D64.9] Patient Active Problem List   Diagnosis Date Noted  . H. pylori infection 01/26/2020  . Heme + stool   . Dark stools   . Acute GI bleeding 01/22/2020  . CKD (chronic kidney disease) stage 2, GFR 60-89 ml/min 01/22/2020  . Acute blood loss anemia 01/22/2020  . Mild renal insufficiency 10/17/2019  . Symptomatic anemia 10/16/2019  . Hypertension    PCP:  Glendon Axe, MD Pharmacy:   [id number] - [place], [place] - [address] AT [place] [address] [place] [address] Phone: [phone] Fax: [phone]     Social Determinants of Health (SDOH) Interventions    Readmission Risk  Interventions No flowsheet data found.

## 2020-03-31 NOTE — Progress Notes (Signed)
Patient already up to the bathroom upon entering room.  States no bm and still passing gas.  Reports pain is now at a 0 on a 0-10 pain scale when sitting up.  Eating meal, soft diet upon leaving the room.  Call light within reach.

## 2020-03-31 NOTE — Plan of Care (Signed)

## 2020-03-31 NOTE — Hospital Course (Signed)
Lori Rush is a 74 year old female with history of hypertension, CKD stage II, iron deficiency anemia, was admitted for symptomatic anemia in 12/2019.  At that time patient had undergone capsule endoscopy which did not show any reason for anemia.  Patient was seen at PCPs office on 8/26 and was informed that her hemoglobin was very low and recommended ER evaluation and blood transfusion.  Patient also reported she had melanotic stools for last several weeks otherwise no abdominal pain.  She also reported increasing fatigue, generalized weakness, no chest pain or shortness of breath. GI was consulted on admission. She received 2 units of PRBC transfusion on 8/27 and hemoglobin improved to 10.   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 She then underwent laparoscopic wedge gastrectomy of lesser curvature on 03/29/2020. Her diet was slowly advanced after surgery.  Of note, she had undergone CT chest/abd/pelvis on 03/26/20 as well. Other notable findings to follow up on at discharge include: 1) Small pulmonary nodule in the LEFT lung base 7 x 6 mm. 2) Bulky uterus with multiple enhancing masses and with a calcified fibroid along the RIGHT uterus. One of these exophytic heterogeneous masses is dumbbell-shaped and extends towards the LEFT adnexa. Area is not well characterized on CT some of these measuring as large as 4.3 x 4.5 cm. The patient is on medicaid and [facility] was found to accept her [place] for referral purposes at dischage.

## 2020-04-01 ENCOUNTER — Other Ambulatory Visit: Payer: Self-pay

## 2020-04-01 DIAGNOSIS — R935 Abnormal findings on diagnostic imaging of other abdominal regions, including retroperitoneum: Secondary | ICD-10-CM

## 2020-04-01 DIAGNOSIS — C49A0 Gastrointestinal stromal tumor, unspecified site: Secondary | ICD-10-CM

## 2020-04-01 LAB — GLUCOSE, CAPILLARY
Glucose-Capillary: 104 mg/dL — ABNORMAL HIGH (ref 70–99)
Glucose-Capillary: 113 mg/dL — ABNORMAL HIGH (ref 70–99)

## 2020-04-01 LAB — BASIC METABOLIC PANEL
Anion gap: 11 (ref 5–15)
BUN: 14 mg/dL (ref 8–23)
CO2: 28 mmol/L (ref 22–32)
Calcium: 8.5 mg/dL — ABNORMAL LOW (ref 8.9–10.3)
Chloride: 100 mmol/L (ref 98–111)
Creatinine, Ser: 0.8 mg/dL (ref 0.44–1.00)
GFR calc Af Amer: >60 mL/min (ref >60)
GFR calc non Af Amer: >60 mL/min (ref >60)
Glucose, Bld: 105 mg/dL — ABNORMAL HIGH (ref 70–99)
Potassium: 3.5 mmol/L (ref 3.5–5.1)
Sodium: 139 mmol/L (ref 135–145)

## 2020-04-01 LAB — CBC WITH DIFFERENTIAL/PLATELET
Abs Immature Granulocytes: 0.01 10*3/uL (ref 0.00–0.07)
Basophils Absolute: 0 10*3/uL (ref 0.0–0.1)
Basophils Relative: 1 %
Eosinophils Absolute: 0.2 10*3/uL (ref 0.0–0.5)
Eosinophils Relative: 3 %
HCT: 30.9 % — ABNORMAL LOW (ref 36.0–46.0)
Hemoglobin: 9.6 g/dL — ABNORMAL LOW (ref 12.0–15.0)
Immature Granulocytes: 0 %
Lymphocytes Relative: 22 %
Lymphs Abs: 1.3 10*3/uL (ref 0.7–4.0)
MCH: 26.4 pg (ref 26.0–34.0)
MCHC: 31.1 g/dL (ref 30.0–36.0)
MCV: 85.1 fL (ref 80.0–100.0)
Monocytes Absolute: 0.7 10*3/uL (ref 0.1–1.0)
Monocytes Relative: 12 %
Neutro Abs: 3.6 10*3/uL (ref 1.7–7.7)
Neutrophils Relative %: 62 %
Platelets: 186 10*3/uL (ref 150–400)
RBC: 3.63 MIL/uL — ABNORMAL LOW (ref 3.87–5.11)
RDW: 17.1 % — ABNORMAL HIGH (ref 11.5–15.5)
WBC: 5.8 10*3/uL (ref 4.0–10.5)
nRBC: 0 % (ref 0.0–0.2)

## 2020-04-01 LAB — MAGNESIUM: Magnesium: 2.2 mg/dL (ref 1.7–2.4)

## 2020-04-01 MED ORDER — PROSOURCE PLUS PO LIQD
30.0000 mL | Freq: Two times a day (BID) | ORAL | Status: DC
  Administered 2020-04-01: 30 mL via ORAL
  Filled 2020-04-01: qty 30

## 2020-04-01 MED ORDER — IRON (FERROUS SULFATE) 325 (65 FE) MG PO TABS: 325.0000 mg | ORAL_TABLET | Freq: Every day | ORAL | Status: DC

## 2020-04-01 MED ORDER — ONDANSETRON HCL 4 MG PO TABS: 4.0000 mg | ORAL_TABLET | Freq: Four times a day (QID) | ORAL | 0 refills | Status: AC | PRN

## 2020-04-01 MED ORDER — OXYCODONE HCL 5 MG PO TABS
5.0000 mg | ORAL_TABLET | Freq: Four times a day (QID) | ORAL | 0 refills | Status: DC | PRN
Start: 2020-04-01 — End: 2020-06-09

## 2020-04-01 MED ORDER — ACETAMINOPHEN 325 MG PO TABS: 650.0000 mg | ORAL_TABLET | Freq: Four times a day (QID) | ORAL | Status: AC | PRN

## 2020-04-01 NOTE — Plan of Care (Signed)
  Problem: Health Behavior/Discharge Planning: Goal: Ability to manage health-related needs will improve Outcome: Progressing   Problem: Clinical Measurements: Goal: Ability to maintain clinical measurements within normal limits will improve Outcome: Progressing Goal: Will remain free from infection Outcome: Progressing Goal: Diagnostic test results will improve Outcome: Progressing Goal: Respiratory complications will improve Outcome: Progressing Goal: Cardiovascular complication will be avoided Outcome: Progressing   Problem: Activity: Goal: Risk for activity intolerance will decrease Outcome: Progressing   Problem: Nutrition: Goal: Adequate nutrition will be maintained Outcome: Progressing   Problem: Coping: Goal: Level of anxiety will decrease Outcome: Progressing   Problem: Elimination: Goal: Will not experience complications related to bowel motility Outcome: Progressing   Problem: Pain Managment: Goal: General experience of comfort will improve Outcome: Progressing   Problem: Safety: Goal: Ability to remain free from injury will improve Outcome: Progressing   Problem: Skin Integrity: Goal: Risk for impaired skin integrity will decrease Outcome: Progressing   Problem: Clinical Measurements: Goal: Postoperative complications will be avoided or minimized Outcome: Progressing   Problem: Skin Integrity: Goal: Demonstration of wound healing without infection will improve Outcome: Progressing

## 2020-04-01 NOTE — Progress Notes (Signed)
Physical Therapy Treatment Patient Details Name: Lori Rush MRN: [id number] DOB: 12-12-1945 Today's Date: 04/01/2020    History of Present Illness 74 yo female s/p lap wedge gastrectomy 8/30. Hx of CKD, anemia.    PT Comments    Pt motivated and this date progressed to ambulate 400' sans AD with just supervision level assist for mild instability.  Pt hopeful to dc home this date.   Follow Up Recommendations  Supervision for mobility/OOB     Equipment Recommendations  None recommended by PT    Recommendations for Other Services       Precautions / Restrictions Precautions Precautions: Fall Restrictions Weight Bearing Restrictions: No    Mobility  Bed Mobility Overal bed mobility: Needs Assistance Bed Mobility: Rolling;Sidelying to Sit;Sit to Sidelying Rolling: Supervision Sidelying to sit: Supervision     Sit to sidelying: Min assist General bed mobility comments: cues for log roll technique to exit/enter bed; min assist to bring LEs back onto bed  Transfers Overall transfer level: Needs assistance   Transfers: Sit to/from Stand Sit to Stand: Supervision         General transfer comment: for safety, hand placement  Ambulation/Gait Ambulation/Gait assistance: Supervision Gait Distance (Feet): 400 Feet Assistive device: None Gait Pattern/deviations: Step-through pattern;Decreased stride length;Wide base of support Gait velocity: decr   General Gait Details: Slow guarded pace with mild general instability but no LOB    Stairs             Wheelchair Mobility    Modified Rankin (Stroke Patients Only)       Balance Overall balance assessment: Needs assistance Sitting-balance support: No upper extremity supported;Feet supported Sitting balance-Leahy Scale: Good     Standing balance support: No upper extremity supported Standing balance-Leahy Scale: Fair                              Cognition Arousal/Alertness:  Awake/alert Behavior During Therapy: WFL for tasks assessed/performed Overall Cognitive Status: Within Functional Limits for tasks assessed                                        Exercises      General Comments        Pertinent Vitals/Pain Pain Assessment: 0-10 Pain Score: 6  Pain Location: abdomen Pain Descriptors / Indicators: Discomfort;Sore Pain Intervention(s): Limited activity within patient's tolerance;Monitored during session;Premedicated before session    Home Living                      Prior Function            PT Goals (current goals can now be found in the care plan section) Acute Rehab PT Goals Patient Stated Goal: home PT Goal Formulation: With patient Time For Goal Achievement: 04/07/20 Potential to Achieve Goals: Good Progress towards PT goals: Progressing toward goals    Frequency    Min 3X/week      PT Plan Current plan remains appropriate    Co-evaluation              AM-PAC PT "6 Clicks" Mobility   Outcome Measure  Help needed turning from your back to your side while in a flat bed without using bedrails?: None Help needed moving from lying on your back to sitting on the side of a flat bed without using  bedrails?: None Help needed moving to and from a bed to a chair (including a wheelchair)?: A Little Help needed standing up from a chair using your arms (e.g., wheelchair or bedside chair)?: A Little Help needed to walk in hospital room?: A Little Help needed climbing 3-5 steps with a railing? : A Little 6 Click Score: 20    End of Session Equipment Utilized During Treatment: Gait belt Activity Tolerance: Patient tolerated treatment well Patient left: in bed;with call bell/phone within reach Nurse Communication: Mobility status PT Visit Diagnosis: Pain;Unsteadiness on feet (R26.81)     Time: 0950-1004 PT Time Calculation (min) (ACUTE ONLY): 14 min  Charges:  $Gait Training: 8-22 mins                      [employer] Pager [phone] Office [phone]    Contessa Preuss 04/01/2020, 12:40 PM

## 2020-04-01 NOTE — Progress Notes (Signed)
Nutrition Follow-up  DOCUMENTATION CODES:   Not applicable  INTERVENTION:  53ml Prosource Plus po BID, each supplement provides 100 kcal and 15 grams of protein  Continue Ensure Enlive po BID, each supplement provides 350 kcal and 20 grams of protein  Continue MVI daily   NUTRITION DIAGNOSIS:   Increased nutrient needs related to acute illness as evidenced by estimated needs.  Ongoing  GOAL:   Patient will meet greater than or equal to 90% of their needs  Progressing  MONITOR:   PO intake, Supplement acceptance, Weight trends, I & O's, Labs  REASON FOR ASSESSMENT:   Malnutrition Screening Tool    ASSESSMENT:   Patient with PMH significant for HTN, CKD II, H.Pylori gastritis, IDA, and EGD on 8/26 that revealed 2 cm submucosal gastric lesion . Presents this admission with GI bleed.  8/27 s/p EGD 8/30 s/p gastrectomy  CT revealed pulmonary nodule and non specific uterine/adnexal masses. Per MD, pt will have outpatient follow-up for these.   Pt has had good appetite since last RD visit. Pt ate 80-90% of all meals on 8/29 when she was on a regular diet. Pt was then NPO for gastrectomy and diet was slowly advanced from clear liquids to full liquids. Pt ate 75-90% of full liquid trays. Pt's diet was advanced to soft yesterday; however, no PO documentation available   Per RN, pt consuming Ensure Enlive well (receiving BID). Pt does not always finish the supplement in its entirety, but typically drinks at least half of the bottle.   Labs reviewed. Medications: Vitamin C, Colace, MVI, Protonix, Miralax   Diet Order:   Diet Order            DIET SOFT Room service appropriate? Yes; Fluid consistency: Thin  Diet effective now                 EDUCATION NEEDS:   Education needs have been addressed  Skin:  Skin Assessment: Reviewed RN Assessment  Last BM:  8/31  Height:   Ht Readings from Last 1 Encounters:  03/26/20 5\' 2"  (1.575 m)    Weight:   Wt  Readings from Last 1 Encounters:  03/29/20 58.7 kg    BMI:  Body mass index is 23.67 kg/m.  Estimated Nutritional Needs:   Kcal:  1550-1750 kcal  Protein:  90-105 grams  Fluid:  >/= 1.5 L/day    Larkin Ina, MS, RD, LDN RD pager number and weekend/on-call pager number located in [place].

## 2020-04-01 NOTE — Progress Notes (Signed)
Patient being d/c in stable condition. Denies pain, nausea or discomfort. Sister is present during d/c instructions, all questions answered to satisfaction.

## 2020-04-01 NOTE — Progress Notes (Signed)
Progress Note: General Surgery Service   Chief Complaint/Subjective: Tolerating diet, ambulating, pain improved  Objective: Vital signs in last 24 hours: Temp:  [98 F (36.7 C)-98.9 F (37.2 C)] 98 F (36.7 C) (09/02 0538) Pulse Rate:  [50-59] 52 (09/02 0538) Resp:  [16-20] 16 (09/02 0538) BP: (137-157)/(69-78) 150/78 (09/02 0538) SpO2:  [94 %-99 %] 97 % (09/02 0538) Last BM Date: 03/30/20  Intake/Output from previous day: 09/01 0701 - 09/02 0700 In: 1020 [P.O.:1020] Out: -  Intake/Output this shift: No intake/output data recorded.  Gen: NAD  Resp: nonlabored  Card: bradycardic  Abd: soft, incisions c/d/i, min pain on palpation, nondistended  Lab Results: CBC  Recent Labs    03/31/20 0301 04/01/20 0321  WBC 6.7 5.8  HGB 9.5* 9.6*  HCT 30.7* 30.9*  PLT 194 186   BMET Recent Labs    03/31/20 0301 04/01/20 0321  NA 138 139  K 3.5 3.5  CL 101 100  CO2 30 28  GLUCOSE 111* 105*  BUN 13 14  CREATININE 0.85 0.80  CALCIUM 8.4* 8.5*   PT/INR No results for input(s): LABPROT, INR in the last 72 hours. ABG No results for input(s): PHART, HCO3 in the last 72 hours.  Invalid input(s): PCO2, PO2  Anti-infectives: Anti-infectives (From admission, onward)   Start     Dose/Rate Route Frequency Ordered Stop   03/29/20 1200  cefoTEtan (CEFOTAN) 2 g in sodium chloride 0.9 % 100 mL IVPB        2 g 200 mL/hr over 30 Minutes Intravenous On call to O.R. 03/28/20 1048 03/29/20 0935      Medications: Scheduled Meds: . (feeding supplement) PROSource Plus  30 mL Oral BID BM  . sodium chloride   Intravenous Once  . amLODipine  5 mg Oral Daily  . vitamin C  250 mg Oral Daily  . docusate sodium  100 mg Oral BID  . enoxaparin (LOVENOX) injection  40 mg Subcutaneous Q24H  . feeding supplement (ENSURE ENLIVE)  237 mL Oral BID BM  . fluticasone  1 spray Each Nare Daily  . metoprolol tartrate  100 mg Oral BID  . multivitamin with minerals  1 tablet Oral Daily  .  pantoprazole  40 mg Oral BID AC  . polyethylene glycol  17 g Oral Daily   Continuous Infusions: . sodium chloride Stopped (03/26/20 1639)   PRN Meds:.sodium chloride, acetaminophen, diphenhydrAMINE, hydrALAZINE, morphine injection, ondansetron **OR** ondansetron (ZOFRAN) IV, oxyCODONE  Assessment/Plan: s/p Procedure(s): LAPAROSCOPIC GASTRECTOMY partial 03/29/2020 -continue diet -ok to discharge today from surgery standpoint -f/u in office in 3 weeks    LOS: 6 days   Mickeal Skinner, MD [facility]

## 2020-04-02 LAB — SURGICAL PATHOLOGY

## 2020-04-02 NOTE — Discharge Summary (Signed)
Physician Discharge Summary  Dalyce Renne HUT:[id number] DOB: 1945/10/09 DOA: 03/26/2020  PCP: Glendon Axe, MD  Admit date: 03/26/2020 Discharge date: 04/02/2020  Admitted From: Home Disposition: Home Discharging physician: Dwyane Dee, MD  Recommendations for Outpatient Follow-up:  1. Patient needs to follow-up with [facility] GYN (or equivalent that accepts Medicaid) 2. Needs continued surveillance/follow-up of left pulmonary nodule 3. 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 Needs referral to oncology to discuss adjuvant imatinib or other treatment   Patient discharged to home in Discharge Condition: stable CODE STATUS: Full Diet recommendation:   Hospital Course: Ms. Basher is a 74 year old female with history of hypertension, CKD stage II, iron deficiency anemia, was admitted for symptomatic anemia in 12/2019.  At that time patient had undergone capsule endoscopy which did not show any reason for anemia.  Patient was seen at PCPs office on 8/26 and was informed that her hemoglobin was very low and recommended ER evaluation and blood transfusion.  Patient also reported she had melanotic stools for last several weeks otherwise no abdominal pain.  She also reported increasing fatigue, generalized weakness, no chest pain or shortness of breath. GI was consulted on admission. She received 2 units of PRBC transfusion on 8/27 and hemoglobin improved to 10.   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 She then underwent laparoscopic wedge gastrectomy of lesser curvature on 03/29/2020. Her diet was slowly advanced after surgery.  Of note, she had undergone CT chest/abd/pelvis on 03/26/20 as well. Other notable findings to follow up on at discharge include: 1) Small pulmonary nodule in the LEFT lung base 7 x 6 mm. 2) Bulky uterus with multiple enhancing masses and with a calcified fibroid along the RIGHT uterus. One of these  exophytic heterogeneous masses is dumbbell-shaped and extends towards the LEFT adnexa. Area is not well characterized on CT some of these measuring as large as 4.3 x 4.5 cm. The patient is on medicaid and [facility] was found to accept her [place] for referral purposes at dischage.   Upper GI bleed with acute on chronic blood loss anemia:Patient seen by GI and underwent repeat EGD/enteroscopy 8/27showing gastric tumor along the lesser curvature of gastric antrum, worrisome for malignancy--although surgical path not confirmatory. 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 General surgery consulted per GI recommendations and patient underwent Laparoscopic resection of gastric tumor 8/30. Tolerated procedure well. Hemoglobin stable around 10 - diet being advanced to soft diet today - will need referral to oncology and GYN outpatient  Hypertension: On Norvasc, metoprolol. Watch for hypotension.  SFK:CLEXNTZ on admission with creatinine at 1.4. Resolved with IVF  Hypokalemia: Replete and recheck as needed  Uterine masses: Possibly fibroids versus metastasis.  Follow-up pathology results -Outpatient follow-up with GYN. [facility] takes medicaid patients.   Pulmonary nodule: Noted to have7 x 6 mmleft lower lobe nodule on CT chest , close attention as outpatient recommended especially in context of above.  The patient's chronic medical conditions were treated accordingly per the patient's home medication regimen except as noted.  On day of discharge, patient was felt deemed stable for discharge. Patient/family member advised to call PCP or come back to ER if needed.   Discharge Diagnoses:   Principal Diagnosis: Symptomatic anemia  Active Hospital Problems   Diagnosis Date Noted  . Symptomatic anemia 10/16/2019  . Acute GI bleeding 01/22/2020  . CKD (chronic kidney disease) stage 2, GFR 60-89 ml/min  01/22/2020  .  Hypertension     Resolved Hospital Problems  No resolved problems to display.    Discharge Instructions    Discharge instructions   Complete by: As directed    Follow up with your primary care provider and obtain referral to oncology and [facility] GYN   Increase activity slowly   Complete by: As directed      Allergies as of 04/01/2020   No Known Allergies     Medication List    TAKE these medications   acetaminophen 325 MG tablet Commonly known as: TYLENOL Take 2 tablets (650 mg total) by mouth every 6 (six) hours as needed for mild pain, fever or headache.   amLODipine 5 MG tablet Commonly known as: NORVASC Take 5 mg by mouth daily.   atorvastatin 20 MG tablet Commonly known as: LIPITOR Take 20 mg by mouth at bedtime.   docusate sodium 100 MG capsule Commonly known as: Colace Take 2 capsules (200 mg total) by mouth daily.   fluticasone 50 MCG/ACT nasal spray Commonly known as: FLONASE Place 1 spray into both nostrils daily as needed for allergies or rhinitis.   Iron (Ferrous Sulfate) 325 (65 Fe) MG Tabs Take 325 mg by mouth at bedtime.   losartan 100 MG tablet Commonly known as: COZAAR Take 100 mg by mouth daily.   metoprolol tartrate 100 MG tablet Commonly known as: LOPRESSOR Take 100 mg by mouth 2 (two) times daily.   ondansetron 4 MG tablet Commonly known as: ZOFRAN Take 1 tablet (4 mg total) by mouth every 6 (six) hours as needed for nausea.   oxyCODONE 5 MG immediate release tablet Commonly known as: Oxy IR/ROXICODONE Take 1 tablet (5 mg total) by mouth every 6 (six) hours as needed for moderate pain.   pantoprazole 40 MG tablet Commonly known as: Protonix Take 1 tablet (40 mg total) by mouth 2 (two) times daily for 14 days, THEN 1 tablet (40 mg total) daily. Start taking on: January 26, 2020 What changed: See the new instructions.   vitamin C 250 MG tablet Commonly known as: ASCORBIC ACID Take 1 tablet (250 mg total) by mouth 2 (two) times  daily. What changed: when to take this       Follow-up Information    Kinsinger, Arta Bruce, MD. Go on 04/21/2020.   Specialty: General Surgery Why: Follow up scheduled for 11:00 AM. Please arrive 30 min prior to appointment time. Bring photo ID and insurance information with you.  Contact information: [address] [place] [address] [phone]        Glendon Axe, MD. Schedule an appointment as soon as possible for a visit in 1 week(s).   Specialty: Family Medicine Why: Ask about referrals to oncology and [facility] information: [place] [address] [phone]              No Known Allergies  Consultations: GI Surgery  Discharge Exam: BP (!) 177/69   Pulse (!) 50   Temp 98 F (36.7 C) (Oral)   Resp 16   Ht 5\' 2"  (1.575 m)   Wt 58.7 kg   SpO2 97%   BMI 23.67 kg/m  General appearance: alert, cooperative, no distress and soft spoken Head: Normocephalic, without obvious abnormality, atraumatic Eyes: EOMI Lungs: clear to auscultation bilaterally Heart: regular rate and rhythm and S1, S2 normal Abdomen: TTP over right quadrants notably at surgical sites; BS present; soft, ND; lap surgical sites noted with  steristrips in place and no surrounding signs of infection Extremities: no edema Skin: mobility and turgor normal Neurologic: Grossly normal  The results of significant diagnostics from this hospitalization (including imaging, microbiology, ancillary and laboratory) are listed below for reference.   Microbiology: Recent Results (from the past 240 hour(s))  SARS Coronavirus 2 by RT PCR (hospital order, performed in [facility] hospital lab) Nasopharyngeal Nasopharyngeal Swab     Status: None   Collection Time: 03/26/20  2:30 AM   Specimen: Nasopharyngeal Swab  Result Value Ref Range Status   SARS Coronavirus 2 NEGATIVE NEGATIVE Final    Comment: (NOTE) SARS-CoV-2 target nucleic acids are NOT DETECTED.  The  SARS-CoV-2 RNA is generally detectable in upper and lower respiratory specimens during the acute phase of infection. The lowest concentration of SARS-CoV-2 viral copies this assay can detect is 250 copies / mL. A negative result does not preclude SARS-CoV-2 infection and should not be used as the sole basis for treatment or other patient management decisions.  A negative result may occur with improper specimen collection / handling, submission of specimen other than nasopharyngeal swab, presence of viral mutation(s) within the areas targeted by this assay, and inadequate number of viral copies (<250 copies / mL). A negative result must be combined with clinical observations, patient history, and epidemiological information.  Fact Sheet for Patients:   StrictlyIdeas.no  Fact Sheet for Healthcare Providers: BankingDealers.co.za  This test is not yet approved or  cleared by the [place] FDA and has been authorized for detection and/or diagnosis of SARS-CoV-2 by FDA under an Emergency Use Authorization (EUA).  This EUA will remain in effect (meaning this test can be used) for the duration of the COVID-19 declaration under Section 564(b)(1) of the Act, 21 U.S.C. section 360bbb-3(b)(1), unless the authorization is terminated or revoked sooner.  Performed at [facility], [place] [address] [place], [place] [address]   Surgical pcr screen     Status: None   Collection Time: 03/29/20  6:04 AM   Specimen: Nasal Mucosa; Nasal Swab  Result Value Ref Range Status   MRSA, PCR NEGATIVE NEGATIVE Final   Staphylococcus aureus NEGATIVE NEGATIVE Final    Comment: (NOTE) The Xpert SA Assay (FDA approved for NASAL specimens in patients 43 years of age and older), is one component of a comprehensive surveillance program. It is not intended to diagnose infection nor to guide or monitor treatment. Performed at [facility], [place] [address], [place], [place] [address]      Labs: BNP (last 3 results) No results for input(s): BNP in the last 8760 hours. Basic Metabolic Panel: Recent Labs  Lab 03/28/20 0258 03/29/20 0355 03/30/20 0308 03/31/20 0301 04/01/20 0321  NA 139 137 139 138 139  K 4.0 3.3* 4.0 3.5 3.5  CL 107 102 105 101 100  CO2 25 25 26 30 28   GLUCOSE 91 106* 106* 111* 105*  BUN 14 19 15 13 14   CREATININE 0.94 1.02* 0.84 0.85 0.80  CALCIUM 8.7* 8.5* 8.1* 8.4* 8.5*  MG  --   --   --   --  2.2   Liver Function Tests: No results for input(s): AST, ALT, ALKPHOS, BILITOT, PROT, ALBUMIN in the last 168 hours. No results for input(s): LIPASE, AMYLASE in the last 168 hours. No results for input(s): AMMONIA in the last 168 hours. CBC: Recent Labs  Lab 03/28/20 0258 03/29/20 0355 03/30/20 0308 03/31/20 0301 04/01/20 0321  WBC 6.9 6.4 9.0 6.7  5.8  NEUTROABS  --   --   --   --  3.6  HGB 10.2* 10.0* 9.7* 9.5* 9.6*  HCT 33.1* 32.4* 30.8* 30.7* 30.9*  MCV 87.1 86.6 85.1 86.5 85.1  PLT 213 222 199 194 186   Cardiac Enzymes: No results for input(s): CKTOTAL, CKMB, CKMBINDEX, TROPONINI in the last 168 hours. BNP: Invalid input(s): POCBNP CBG: Recent Labs  Lab 03/31/20 0017 03/31/20 0829 03/31/20 1702 04/01/20 0037 04/01/20 0807  GLUCAP 127* 96 125* 113* 104*   D-Dimer No results for input(s): DDIMER in the last 72 hours. Hgb A1c No results for input(s): HGBA1C in the last 72 hours. Lipid Profile No results for input(s): CHOL, HDL, LDLCALC, TRIG, CHOLHDL, LDLDIRECT in the last 72 hours. Thyroid function studies No results for input(s): TSH, T4TOTAL, T3FREE, THYROIDAB in the last 72 hours.  Invalid input(s): FREET3 Anemia work up No results for input(s): VITAMINB12, FOLATE, FERRITIN, TIBC, IRON, RETICCTPCT in the last 72 hours. Urinalysis    Component Value Date/Time   COLORURINE STRAW (A) 01/22/2020 2130   APPEARANCEUR CLEAR 01/22/2020 2130   LABSPEC 1.009  01/22/2020 2130   PHURINE 6.0 01/22/2020 2130   GLUCOSEU NEGATIVE 01/22/2020 2130   HGBUR NEGATIVE 01/22/2020 2130   BILIRUBINUR NEGATIVE 01/22/2020 2130   KETONESUR NEGATIVE 01/22/2020 2130   PROTEINUR NEGATIVE 01/22/2020 2130   NITRITE NEGATIVE 01/22/2020 2130   LEUKOCYTESUR NEGATIVE 01/22/2020 2130   Sepsis Labs Invalid input(s): PROCALCITONIN,  WBC,  LACTICIDVEN Microbiology Recent Results (from the past 240 hour(s))  SARS Coronavirus 2 by RT PCR (hospital order, performed in [facility] hospital lab) Nasopharyngeal Nasopharyngeal Swab     Status: None   Collection Time: 03/26/20  2:30 AM   Specimen: Nasopharyngeal Swab  Result Value Ref Range Status   SARS Coronavirus 2 NEGATIVE NEGATIVE Final    Comment: (NOTE) SARS-CoV-2 target nucleic acids are NOT DETECTED.  The SARS-CoV-2 RNA is generally detectable in upper and lower respiratory specimens during the acute phase of infection. The lowest concentration of SARS-CoV-2 viral copies this assay can detect is 250 copies / mL. A negative result does not preclude SARS-CoV-2 infection and should not be used as the sole basis for treatment or other patient management decisions.  A negative result may occur with improper specimen collection / handling, submission of specimen other than nasopharyngeal swab, presence of viral mutation(s) within the areas targeted by this assay, and inadequate number of viral copies (<250 copies / mL). A negative result must be combined with clinical observations, patient history, and epidemiological information.  Fact Sheet for Patients:   StrictlyIdeas.no  Fact Sheet for Healthcare Providers: BankingDealers.co.za  This test is not yet approved or  cleared by the [place] FDA and has been authorized for detection and/or diagnosis of SARS-CoV-2 by FDA under an Emergency Use Authorization (EUA).  This EUA will remain in effect (meaning this test  can be used) for the duration of the COVID-19 declaration under Section 564(b)(1) of the Act, 21 U.S.C. section 360bbb-3(b)(1), unless the authorization is terminated or revoked sooner.  Performed at [facility], [place] [address] [place], [place] [address]   Surgical pcr screen     Status: None   Collection Time: 03/29/20  6:04 AM   Specimen: Nasal Mucosa; Nasal Swab  Result Value Ref Range Status   MRSA, PCR NEGATIVE NEGATIVE Final   Staphylococcus aureus NEGATIVE NEGATIVE Final    Comment: (NOTE) The Xpert SA Assay (FDA approved for NASAL specimens in patients  8 years of age and older), is one component of a comprehensive surveillance program. It is not intended to diagnose infection nor to guide or monitor treatment. Performed at [facility], [place] [address] [place], [place] [address]     Procedures/Studies: CT CHEST W CONTRAST  Result Date: 03/26/2020 CLINICAL DATA:  Submucosal mass on EGD EXAM: CT CHEST, ABDOMEN, AND PELVIS WITH CONTRAST TECHNIQUE: Multidetector CT imaging of the chest, abdomen and pelvis was performed following the standard protocol during bolus administration of intravenous contrast. CONTRAST:  17mL OMNIPAQUE IOHEXOL 300 MG/ML  SOLN COMPARISON:  None FINDINGS: CT CHEST FINDINGS Cardiovascular: Calcified and noncalcified plaque throughout the thoracic aorta. No aneurysmal dilation. Central pulmonary vasculature on venous phase assessment is unremarkable. Heart size mildly enlarged with no pericardial effusion. Mediastinum/Nodes: Thoracic inlet structures are unremarkable. No axillary lymphadenopathy. No mediastinal adenopathy. No hilar adenopathy. Lungs/Pleura: Airways are patent. Mild atelectasis. Small pulmonary nodule in the LEFT lung base 7 x 6 mm (image 120, series 4) Musculoskeletal: No acute or destructive bone process relative to the bony thorax. See below for full musculoskeletal details. CT ABDOMEN PELVIS FINDINGS  Hepatobiliary: No focal, suspicious hepatic lesion. The portal vein is patent. Mild biliary duct dilation to approximately 5-6 mm is nonspecific. No pericholecystic stranding or fluid. No gallbladder distension. Pancreas: Pancreas is normal without ductal dilation or inflammation. Spleen: Normal in size and contour. Adrenals/Urinary Tract: Adrenal glands are normal. Renal enhancement is symmetric. 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 Two ureters are present to at least the level of the distal third of the RIGHT ureter. Stomach/Bowel: Lesion along the lesser curvature of the stomach with rounded, well-circumscribed margins and heterogeneous enhancement measuring 5.8 x 4.8 x 5.3 cm reportedly submucosal on endoscopic evaluation extending into the hepatic gastric ligament. Ulceration and or site of biopsy along the inferior margin. Small-bowel without acute process. Appendix is normal. Vascular/Lymphatic: Calcified atheromatous plaque in the abdominal aorta. No adenopathy in the retroperitoneum. No upper abdominal lymphadenopathy. Reproductive: Bulky uterus with multiple enhancing masses and with a calcified fibroid along the RIGHT uterus. One of these exophytic heterogeneous masses is dumbbell-shaped and extends towards the LEFT adnexa. Area is not well characterized on CT some of these measuring as large as 4.3 x 4.5 cm. Other: No ascites. There is however a small ventral hernia containing fluid density just below the xiphoid, of uncertain significance given the absence of ascites or the presence of morphologic changes of liver disease. Musculoskeletal: Spinal degenerative changes. Dextroconvex curvature of the spine. L5-S1 anterolisthesis, grade 2 associated with degenerative changes. IMPRESSION: 1. 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 2. Bulky appearance of the uterus with heterogeneous masslike areas  more likely fibroids particularly given the calcified appearance of the RIGHT uterus. One of these lesions displays a more lobular morphology and extends towards the LEFT adnexa. Leiomyomata are considered. In this 74 year old female would correlate with any changes in pelvic symptoms, bleeding or enlargement of the uterus noted on physical examination. Follow-up pelvic MRI may be beneficial given the atypical features of what may be a large subserosal leiomyoma along the LEFT fundus to exclude presence of malignant transformation. 3. Small ventral hernia containing fluid density just below the xiphoid, of uncertain significance given the absence of ascites or the presence of morphologic changes of liver disease. Focused ultrasound of this area may be helpful. No soft tissue nodularity is  visible on CT. 4. Small pulmonary nodule in the LEFT lung base 7 x 6 mm. Close attention on follow-up is suggested. This remains nonspecific but suspicious given other findings. 5. collecting systems of the RIGHT kidney are dilated with 2 ureters to at least the level of the sacral promontory. Findings may reflect chronic reflux and mild obstruction. Correlate with any symptoms and consider follow-up renal sonogram to assess for any changes over time. 6. Aortic atherosclerosis. These results will be called to the ordering clinician or representative by the Radiologist Assistant, and communication documented in the PACS or [employer]. Aortic Atherosclerosis (ICD10-I70.0). Electronically Signed   By: Zetta Bills M.D.   On: 03/26/2020 17:55   CT ABDOMEN PELVIS W CONTRAST  Result Date: 03/26/2020 CLINICAL DATA:  Submucosal mass on EGD EXAM: CT CHEST, ABDOMEN, AND PELVIS WITH CONTRAST TECHNIQUE: Multidetector CT imaging of the chest, abdomen and pelvis was performed following the standard protocol during bolus administration of intravenous contrast. CONTRAST:  162mL OMNIPAQUE IOHEXOL 300 MG/ML  SOLN COMPARISON:  None  FINDINGS: CT CHEST FINDINGS Cardiovascular: Calcified and noncalcified plaque throughout the thoracic aorta. No aneurysmal dilation. Central pulmonary vasculature on venous phase assessment is unremarkable. Heart size mildly enlarged with no pericardial effusion. Mediastinum/Nodes: Thoracic inlet structures are unremarkable. No axillary lymphadenopathy. No mediastinal adenopathy. No hilar adenopathy. Lungs/Pleura: Airways are patent. Mild atelectasis. Small pulmonary nodule in the LEFT lung base 7 x 6 mm (image 120, series 4) Musculoskeletal: No acute or destructive bone process relative to the bony thorax. See below for full musculoskeletal details. CT ABDOMEN PELVIS FINDINGS Hepatobiliary: No focal, suspicious hepatic lesion. The portal vein is patent. Mild biliary duct dilation to approximately 5-6 mm is nonspecific. No pericholecystic stranding or fluid. No gallbladder distension. Pancreas: Pancreas is normal without ductal dilation or inflammation. Spleen: Normal in size and contour. Adrenals/Urinary Tract: Adrenal glands are normal. Renal enhancement is symmetric. 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 Two ureters are present to at least the level of the distal third of the RIGHT ureter. Stomach/Bowel: Lesion along the lesser curvature of the stomach with rounded, well-circumscribed margins and heterogeneous enhancement measuring 5.8 x 4.8 x 5.3 cm reportedly submucosal on endoscopic evaluation extending into the hepatic gastric ligament. Ulceration and or site of biopsy along the inferior margin. Small-bowel without acute process. Appendix is normal. Vascular/Lymphatic: Calcified atheromatous plaque in the abdominal aorta. No adenopathy in the retroperitoneum. No upper abdominal lymphadenopathy. Reproductive: Bulky uterus with multiple enhancing masses and with a calcified fibroid along the RIGHT uterus. One of these  exophytic heterogeneous masses is dumbbell-shaped and extends towards the LEFT adnexa. Area is not well characterized on CT some of these measuring as large as 4.3 x 4.5 cm. Other: No ascites. There is however a small ventral hernia containing fluid density just below the xiphoid, of uncertain significance given the absence of ascites or the presence of morphologic changes of liver disease. Musculoskeletal: Spinal degenerative changes. Dextroconvex curvature of the spine. L5-S1 anterolisthesis, grade 2 associated with degenerative changes. IMPRESSION: 1. 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 2. Bulky appearance of the uterus with heterogeneous masslike areas more likely fibroids particularly given the calcified appearance of the RIGHT uterus. One of these lesions displays a more lobular morphology and extends towards the LEFT adnexa. Leiomyomata are considered. In this 74 year old female would correlate with any changes in pelvic symptoms, bleeding or  enlargement of the uterus noted on physical examination. Follow-up pelvic MRI may be beneficial given the atypical features of what may be a large subserosal leiomyoma along the LEFT fundus to exclude presence of malignant transformation. 3. Small ventral hernia containing fluid density just below the xiphoid, of uncertain significance given the absence of ascites or the presence of morphologic changes of liver disease. Focused ultrasound of this area may be helpful. No soft tissue nodularity is visible on CT. 4. Small pulmonary nodule in the LEFT lung base 7 x 6 mm. Close attention on follow-up is suggested. This remains nonspecific but suspicious given other findings. 5. collecting systems of the RIGHT kidney are dilated with 2 ureters to at least the level of the sacral promontory. Findings may reflect chronic reflux and mild obstruction. Correlate with any symptoms and consider follow-up renal sonogram to assess for any changes over time. 6.  Aortic atherosclerosis. These results will be called to the ordering clinician or representative by the Radiologist Assistant, and communication documented in the PACS or [employer]. Aortic Atherosclerosis (ICD10-I70.0). Electronically Signed   By: Zetta Bills M.D.   On: 03/26/2020 17:55   DG Chest Port 1 View  Result Date: 03/29/2020 CLINICAL DATA:  Preoperative respiratory evaluation. EXAM: PORTABLE CHEST 1 VIEW COMPARISON:  None. FINDINGS: 0527 hours. The lungs are clear without focal pneumonia, edema, pneumothorax or pleural effusion. The cardio pericardial silhouette is enlarged. The visualized bony structures of the thorax show now acute abnormality. IMPRESSION: No active disease. Electronically Signed   By: Misty Stanley M.D.   On: 03/29/2020 07:40     Time coordinating discharge: Over 30 minutes    Dwyane Dee, MD  [facility] 04/02/2020, 4:32 PM Pager: Secure chat  If 7PM-7AM, please contact night-coverage [email or web address] Password [email or web address]

## 2020-04-27 ENCOUNTER — Encounter: Payer: Self-pay | Admitting: *Deleted

## 2020-04-27 NOTE — Progress Notes (Signed)
Reached out to Lori Rush to introduce myself as the office RN Navigator, and spoke to her sister, Francesca Jewett, to and explain our new patient process. Reviewed the reason for their referral and scheduled their new patient appointment along with labs. Provided address and directions to the office including call back phone number. Reviewed with patient any concerns they may have or any possible barriers to attending their appointment.     Oncology Nurse Navigator Documentation  Oncology Nurse Navigator Flowsheets 04/27/2020  Abnormal Finding Date 03/26/2020  Confirmed Diagnosis Date 03/29/2020  Diagnosis Status Confirmed Diagnosis Complete  Navigator Follow Up Date: 05/05/2020  Navigator Follow Up Reason: New Patient Appointment  Navigator Location [facility]  Referral Date to RadOnc/MedOnc 04/26/2020  Navigator Encounter Type Introductory Phone Call  Patient Visit Type MedOnc  Treatment Phase Pre-Tx/Tx Discussion  Barriers/Navigation Needs Coordination of Care;Education;Language/Communication  Education Other  Interventions Coordination of Care;Education  Acuity Level 3-Moderate Needs (3-4 Barriers Identified)  Coordination of Care Appts  Education Method Verbal  Support Groups/Services Friends and Family  Time Spent with Patient 1

## 2020-05-05 ENCOUNTER — Encounter: Payer: Self-pay | Admitting: *Deleted

## 2020-05-05 ENCOUNTER — Inpatient Hospital Stay (HOSPITAL_BASED_OUTPATIENT_CLINIC_OR_DEPARTMENT_OTHER): Payer: Medicaid Other | Admitting: Hematology & Oncology

## 2020-05-05 ENCOUNTER — Other Ambulatory Visit: Payer: Self-pay

## 2020-05-05 ENCOUNTER — Encounter: Payer: Self-pay | Admitting: Hematology & Oncology

## 2020-05-05 ENCOUNTER — Telehealth: Payer: Self-pay | Admitting: Hematology & Oncology

## 2020-05-05 ENCOUNTER — Inpatient Hospital Stay: Payer: Medicaid Other | Attending: Hematology & Oncology

## 2020-05-05 VITALS — BP 169/79 | HR 52 | Temp 98.2°F | Resp 18 | Ht 62.0 in | Wt 127.8 lb

## 2020-05-05 DIAGNOSIS — I10 Essential (primary) hypertension: Secondary | ICD-10-CM | POA: Insufficient documentation

## 2020-05-05 DIAGNOSIS — Z79899 Other long term (current) drug therapy: Secondary | ICD-10-CM | POA: Diagnosis not present

## 2020-05-05 DIAGNOSIS — C49A2 Gastrointestinal stromal tumor of stomach: Secondary | ICD-10-CM

## 2020-05-05 DIAGNOSIS — Z833 Family history of diabetes mellitus: Secondary | ICD-10-CM | POA: Diagnosis not present

## 2020-05-05 DIAGNOSIS — Z8249 Family history of ischemic heart disease and other diseases of the circulatory system: Secondary | ICD-10-CM

## 2020-05-05 DIAGNOSIS — D649 Anemia, unspecified: Secondary | ICD-10-CM | POA: Insufficient documentation

## 2020-05-05 LAB — CBC WITH DIFFERENTIAL (CANCER CENTER ONLY)
Abs Immature Granulocytes: 0.01 10*3/uL (ref 0.00–0.07)
Basophils Absolute: 0 10*3/uL (ref 0.0–0.1)
Basophils Relative: 1 %
Eosinophils Absolute: 0.1 10*3/uL (ref 0.0–0.5)
Eosinophils Relative: 3 %
HCT: 40.4 % (ref 36.0–46.0)
Hemoglobin: 12.4 g/dL (ref 12.0–15.0)
Immature Granulocytes: 0 %
Lymphocytes Relative: 30 %
Lymphs Abs: 1.3 10*3/uL (ref 0.7–4.0)
MCH: 25.4 pg — ABNORMAL LOW (ref 26.0–34.0)
MCHC: 30.7 g/dL (ref 30.0–36.0)
MCV: 82.8 fL (ref 80.0–100.0)
Monocytes Absolute: 0.4 10*3/uL (ref 0.1–1.0)
Monocytes Relative: 8 %
Neutro Abs: 2.6 10*3/uL (ref 1.7–7.7)
Neutrophils Relative %: 58 %
Platelet Count: 164 10*3/uL (ref 150–400)
RBC: 4.88 MIL/uL (ref 3.87–5.11)
RDW: 13.9 % (ref 11.5–15.5)
WBC Count: 4.4 10*3/uL (ref 4.0–10.5)
nRBC: 0 % (ref 0.0–0.2)

## 2020-05-05 LAB — CMP (CANCER CENTER ONLY)
ALT: 18 U/L (ref 0–44)
AST: 22 U/L (ref 15–41)
Albumin: 4.3 g/dL (ref 3.5–5.0)
Alkaline Phosphatase: 81 U/L (ref 38–126)
Anion gap: 5 (ref 5–15)
BUN: 15 mg/dL (ref 8–23)
CO2: 35 mmol/L — ABNORMAL HIGH (ref 22–32)
Calcium: 9.9 mg/dL (ref 8.9–10.3)
Chloride: 101 mmol/L (ref 98–111)
Creatinine: 1.1 mg/dL — ABNORMAL HIGH (ref 0.44–1.00)
GFR, Estimated: 49 mL/min — ABNORMAL LOW (ref >60)
Glucose, Bld: 155 mg/dL — ABNORMAL HIGH (ref 70–99)
Potassium: 3.8 mmol/L (ref 3.5–5.1)
Sodium: 141 mmol/L (ref 135–145)
Total Bilirubin: 0.5 mg/dL (ref 0.3–1.2)
Total Protein: 7.8 g/dL (ref 6.5–8.1)

## 2020-05-05 LAB — LACTATE DEHYDROGENASE: LDH: 212 U/L — ABNORMAL HIGH (ref 98–192)

## 2020-05-05 NOTE — Progress Notes (Signed)
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 New Patient (Initial Visit)  : I had a tumor removed from my stomach.  HPI: Lori Rush is a very charming 74 year old African female.  She comes in with a sister.  She is from [place].  She has been in [place] for many years.  She has been pretty healthy.  She is certain does not look her age.  Over the past several months, she been having problems with anemia.  She has had weakness.  She has been having anemia with blood loss.  She has been transfused.  She has had colonoscopies originally.  Nothing was found.  Finally, she underwent an upper endoscopy.  This was done on 27 August.  A 5-6 cm submucosal mass was located in the lesser curvature of the stomach.  This was biopsied.  The pathology report ( [id number]) showed hyperplastic gastric mucosa.  There was no intestinal metaplasia or obvious malignancy.  She continued to lose blood.  She subsequently was sent to surgery.  She had surgery on 03/29/2020.  She had laparoscopic resection.  She underwent a wedge gastrectomy of the lesser curvature.  At the time of surgery, she was found to have this large mass on the lesser curvature.  The pathology report of this mass ([id number]) showed a relatively large gastrointestinal stromal tumor.  It measures 6.4 cm.  It has 7 mitoses per 50 high-power field.  It was high-grade.  All margins were negative.  By staging, this is a stage IIIA GIST.  She is not considered high risk.  By all the prognostic categories, she would be considered high risk.  She was, referred to the [facility] for an evaluation.  There is no bleeding.  She recovered from surgery quite nicely.  She has no abdominal pain.  She is eating okay.  She has had no nausea or vomiting.  She does not smoke.  She does not drink.  She still has family over in [place].  Currently, her  performance status is ECOG 1.    Past Medical History:  Diagnosis Date  . Hypertension   :  Past Surgical History:  Procedure Laterality Date  . BIOPSY  03/26/2020   Procedure: BIOPSY;  Surgeon: Doran Stabler, MD;  Location: [facility];  Service: Gastroenterology;;  . ESOPHAGOGASTRODUODENOSCOPY (EGD) WITH PROPOFOL N/A 03/26/2020   Procedure: ESOPHAGOGASTRODUODENOSCOPY (EGD) WITH PROPOFOL;  Surgeon: Doran Stabler, MD;  Location: [facility];  Service: Gastroenterology;  Laterality: N/A;  . GIVENS CAPSULE STUDY N/A 01/24/2020   Procedure: GIVENS CAPSULE STUDY;  Surgeon: Yetta Flock, MD;  Location: [facility];  Service: Gastroenterology;  Laterality: N/A;  . LAPAROSCOPIC GASTRECTOMY N/A 03/29/2020   Procedure: LAPAROSCOPIC GASTRECTOMY partial;  Surgeon: Kieth Brightly Arta Bruce, MD;  Location: [facility];  Service: General;  Laterality: N/A;  :   Current Outpatient Medications:  .  amLODipine (NORVASC) 5 MG tablet, Take 5 mg by mouth daily., Disp: , Rfl:  .  atorvastatin (LIPITOR) 20 MG tablet, Take 20 mg by mouth at bedtime., Disp: , Rfl:  .  docusate sodium (COLACE) 100 MG capsule, Take 2 capsules (200 mg total) by mouth daily., Disp: 30 capsule, Rfl: 2 .  fluticasone (FLONASE) 50 MCG/ACT nasal spray, Place 1 spray into both nostrils daily as needed for allergies or rhinitis. , Disp: , Rfl:  .  Iron, Ferrous Sulfate, 325 (65 Fe)  MG TABS, Take 325 mg by mouth at bedtime., Disp: , Rfl:  .  losartan (COZAAR) 100 MG tablet, Take 100 mg by mouth daily., Disp: , Rfl:  .  metoprolol (LOPRESSOR) 100 MG tablet, Take 100 mg by mouth 2 (two) times daily., Disp: , Rfl:  .  ondansetron (ZOFRAN) 4 MG tablet, Take 1 tablet (4 mg total) by mouth every 6 (six) hours as needed for nausea., Disp: 20 tablet, Rfl: 0 .  oxyCODONE (OXY IR/ROXICODONE) 5 MG immediate release tablet, Take 1 tablet (5 mg total) by mouth every 6 (six) hours as needed for moderate pain., Disp: 20 tablet, Rfl: 0 .   vitamin C (ASCORBIC ACID) 250 MG tablet, Take 1 tablet (250 mg total) by mouth 2 (two) times daily. (Patient taking differently: Take 250 mg by mouth daily. ), Disp: , Rfl:  .  acetaminophen (TYLENOL) 325 MG tablet, Take 2 tablets (650 mg total) by mouth every 6 (six) hours as needed for mild pain, fever or headache. (Patient not taking: Reported on 05/05/2020), Disp: , Rfl:  .  pantoprazole (PROTONIX) 40 MG tablet, Take 1 tablet (40 mg total) by mouth 2 (two) times daily for 14 days, THEN 1 tablet (40 mg total) daily. (Patient taking differently: Take one tablet (40mg ) once daily.), Disp: 58 tablet, Rfl: 0:  :  No Known Allergies:  Family History  Problem Relation Age of Onset  . Diabetes Mother   . Diabetes Father   . Hypertension Father   :  Social History   Socioeconomic History  . Marital status: Single    Spouse name: Not on file  . Number of children: Not on file  . Years of education: Not on file  . Highest education level: Not on file  Occupational History  . Not on file  Tobacco Use  . Smoking status: Never Smoker  . Smokeless tobacco: Never Used  Vaping Use  . Vaping Use: Never used  Substance and Sexual Activity  . Alcohol use: No  . Drug use: No  . Sexual activity: Not on file  Other Topics Concern  . Not on file  Social History Narrative  . Not on file   Social Determinants of Health   Financial Resource Strain:   . Difficulty of Paying Living Expenses: Not on file  Food Insecurity:   . Worried About Charity fundraiser in the Last Year: Not on file  . Ran Out of Food in the Last Year: Not on file  Transportation Needs:   . Lack of Transportation (Medical): Not on file  . Lack of Transportation (Non-Medical): Not on file  Physical Activity:   . Days of Exercise per Week: Not on file  . Minutes of Exercise per Session: Not on file  Stress:   . Feeling of Stress : Not on file  Social Connections:   . Frequency of Communication with Friends and Family:  Not on file  . Frequency of Social Gatherings with Friends and Family: Not on file  . Attends Religious Services: Not on file  . Active Member of Clubs or Organizations: Not on file  . Attends Archivist Meetings: Not on file  . Marital Status: Not on file  Intimate Partner Violence:   . Fear of Current or Ex-Partner: Not on file  . Emotionally Abused: Not on file  . Physically Abused: Not on file  . Sexually Abused: Not on file  :  Review of Systems  Constitutional: Negative.   HENT:  Negative.   Eyes: Negative.   Respiratory: Negative.   Cardiovascular: Negative.   Gastrointestinal: Negative.   Genitourinary: Negative.   Musculoskeletal: Negative.   Skin: Negative.   Neurological: Negative.   Endo/Heme/Allergies: Negative.   Psychiatric/Behavioral: Negative.      Exam:   This is a fairly well-developed and well-nourished African female in no obvious distress.  Vital signs show temperature of 98.2.  Pulse 52.  Blood pressure 169/79.  Weight is 127 pounds.  Head and neck exam shows no ocular or oral lesions.  She has no palpable cervical or supraclavicular lymph nodes.  Lungs are clear to percussion and auscultation bilaterally.  Cardiac exam regular rate and rhythm with no murmurs, rubs or bruits.  Abdominal exam shows laparoscopy scars that are well-healed.  She has no abdominal distention.  There is no guarding or rebound tenderness.  She has no palpable liver or spleen tip.  Back exam shows no tenderness over the spine, ribs or hips.  Extremities shows no clubbing, cyanosis or edema.  Neurological exam shows no focal neurological deficits.  Skin exam shows no rashes, ecchymoses or petechia.   @IPVITALS @   Recent Labs    05/05/20 1032  WBC 4.4  HGB 12.4  HCT 40.4  PLT 164   Recent Labs    05/05/20 1032  NA 141  K 3.8  CL 101  CO2 35*  GLUCOSE 155*  BUN 15  CREATININE 1.10*  CALCIUM 9.9    Blood smear review: None  Pathology: See  above    Assessment and Plan: Ms. Janco is a very charming 74 year old African female.  She is originally from [place].  She has a stage IIIA GIST of the lesser curvature of the stomach.  This would be considered high risk.  She has greater than 5 mitotic figures.  She has a tumor greater than 5 cm.  Her progression free survival is only 45%.  I really think that she is a candidate for adjuvant Gleevec.  I talked to she and her sister about this.  She is not sure she wants to take [place].  I told her that this was a pill.  I went over the side effects.  I think she would tolerate Gleevec well.  I think that her cancer certainly has a risk of coming back.  If it comes back, I am not sure that it could be cured.  I try to emphasize the fact that with surgery, she could be curative right now and that the [place] is an [employer]" to try to decrease the risk of recurrence.  She is going to think about taking Gleevec.  She will let us know whether she wants to take Clarissa or not.  I spent about an hour with Ms. Campa and her sister.  They are both very very nice.  Again she has a very strong faith.  We had a good prayer.  I gave her a prayer blanket.  I will try to get her back in about 4 to 5 weeks.  We will see how she feels at that time.  I am glad that she is not anemic.  I thought she would be anemic and would be markedly iron deficient.

## 2020-05-05 NOTE — Progress Notes (Signed)
Initial RN Navigator Patient Visit  Name: Lori Rush Date of Referral : 04/26/2020 Diagnosis: GIST  Met with patient prior to their visit with MD. Hanley Seamen patient "Your Patient Navigator" handout which explains my role, areas in which I am able to help, and all the contact information for myself and the office. Also gave patient MD and Navigator business card. Reviewed with patient the general overview of expected course after initial diagnosis and time frame for all steps to be completed.  Patient completed visit with Dr. Marin Olp  MD has not completed note at this time, or care plan. Will follow up tomorrow for needs.   Patient understands all follow up procedures and expectations. They have my number to reach out for any further clarification or additional needs.   Oncology Nurse Navigator Documentation  Oncology Nurse Navigator Flowsheets 05/05/2020  Abnormal Finding Date -  Confirmed Diagnosis Date -  Diagnosis Status -  Navigator Follow Up Date: 06/09/2020  Navigator Follow Up Reason: Follow-up Appointment  Navigator Location [facility]  Referral Date to RadOnc/MedOnc -  Navigator Encounter Type Initial MedOnc  Patient Visit Type MedOnc  Treatment Phase Pre-Tx/Tx Discussion  Barriers/Navigation Needs Coordination of Care;Education;Language/Communication  Education Other  Interventions Education;Psycho-Social Support  Acuity Level 3-Moderate Needs (3-4 Barriers Identified)  Coordination of Care -  Education Method Verbal;Written  Support Groups/Services Friends and Family  Time Spent with Patient 30

## 2020-05-05 NOTE — Telephone Encounter (Signed)
Appointments scheduled calendar printed per 10/6 los

## 2020-05-06 LAB — IRON AND TIBC
Iron: 119 ug/dL (ref 41–142)
Saturation Ratios: 37 % (ref 21–57)
TIBC: 320 ug/dL (ref 236–444)
UIBC: 201 ug/dL (ref 120–384)

## 2020-05-06 LAB — FERRITIN: Ferritin: 77 ng/mL (ref 11–307)

## 2020-06-09 ENCOUNTER — Other Ambulatory Visit: Payer: Self-pay | Admitting: Hematology & Oncology

## 2020-06-09 ENCOUNTER — Telehealth: Payer: Self-pay | Admitting: Pharmacy Technician

## 2020-06-09 ENCOUNTER — Inpatient Hospital Stay (HOSPITAL_BASED_OUTPATIENT_CLINIC_OR_DEPARTMENT_OTHER): Payer: Medicaid Other | Admitting: Hematology & Oncology

## 2020-06-09 ENCOUNTER — Encounter: Payer: Self-pay | Admitting: Hematology & Oncology

## 2020-06-09 ENCOUNTER — Telehealth: Payer: Self-pay

## 2020-06-09 ENCOUNTER — Other Ambulatory Visit: Payer: Self-pay

## 2020-06-09 ENCOUNTER — Inpatient Hospital Stay: Payer: Medicaid Other | Attending: Hematology & Oncology

## 2020-06-09 VITALS — BP 167/82 | HR 48 | Temp 97.8°F | Resp 20 | Wt 128.8 lb

## 2020-06-09 DIAGNOSIS — Z7189 Other specified counseling: Secondary | ICD-10-CM | POA: Diagnosis not present

## 2020-06-09 DIAGNOSIS — C49A2 Gastrointestinal stromal tumor of stomach: Secondary | ICD-10-CM | POA: Insufficient documentation

## 2020-06-09 DIAGNOSIS — Z79899 Other long term (current) drug therapy: Secondary | ICD-10-CM | POA: Diagnosis not present

## 2020-06-09 DIAGNOSIS — R609 Edema, unspecified: Secondary | ICD-10-CM | POA: Insufficient documentation

## 2020-06-09 DIAGNOSIS — C49A0 Gastrointestinal stromal tumor, unspecified site: Secondary | ICD-10-CM

## 2020-06-09 HISTORY — DX: Other specified counseling: Z71.89

## 2020-06-09 HISTORY — DX: Gastrointestinal stromal tumor, unspecified site: C49.A0

## 2020-06-09 LAB — CMP (CANCER CENTER ONLY)
ALT: 14 U/L (ref 0–44)
AST: 24 U/L (ref 15–41)
Albumin: 4.1 g/dL (ref 3.5–5.0)
Alkaline Phosphatase: 64 U/L (ref 38–126)
Anion gap: 6 (ref 5–15)
BUN: 11 mg/dL (ref 8–23)
CO2: 32 mmol/L (ref 22–32)
Calcium: 9.6 mg/dL (ref 8.9–10.3)
Chloride: 101 mmol/L (ref 98–111)
Creatinine: 1.03 mg/dL — ABNORMAL HIGH (ref 0.44–1.00)
GFR, Estimated: 56 mL/min — ABNORMAL LOW (ref >60)
Glucose, Bld: 95 mg/dL (ref 70–99)
Potassium: 3.6 mmol/L (ref 3.5–5.1)
Sodium: 139 mmol/L (ref 135–145)
Total Bilirubin: 0.6 mg/dL (ref 0.3–1.2)
Total Protein: 7.6 g/dL (ref 6.5–8.1)

## 2020-06-09 LAB — CBC WITH DIFFERENTIAL (CANCER CENTER ONLY)
Abs Immature Granulocytes: 0.02 10*3/uL (ref 0.00–0.07)
Basophils Absolute: 0.1 10*3/uL (ref 0.0–0.1)
Basophils Relative: 1 %
Eosinophils Absolute: 0.2 10*3/uL (ref 0.0–0.5)
Eosinophils Relative: 3 %
HCT: 39.2 % (ref 36.0–46.0)
Hemoglobin: 12 g/dL (ref 12.0–15.0)
Immature Granulocytes: 0 %
Lymphocytes Relative: 27 %
Lymphs Abs: 1.6 10*3/uL (ref 0.7–4.0)
MCH: 24.7 pg — ABNORMAL LOW (ref 26.0–34.0)
MCHC: 30.6 g/dL (ref 30.0–36.0)
MCV: 80.8 fL (ref 80.0–100.0)
Monocytes Absolute: 0.7 10*3/uL (ref 0.1–1.0)
Monocytes Relative: 11 %
Neutro Abs: 3.5 10*3/uL (ref 1.7–7.7)
Neutrophils Relative %: 58 %
Platelet Count: 123 10*3/uL — ABNORMAL LOW (ref 150–400)
RBC: 4.85 MIL/uL (ref 3.87–5.11)
RDW: 13.4 % (ref 11.5–15.5)
WBC Count: 6 10*3/uL (ref 4.0–10.5)
nRBC: 0 % (ref 0.0–0.2)

## 2020-06-09 MED ORDER — IMATINIB MESYLATE 400 MG PO TABS: 400.0000 mg | ORAL_TABLET | Freq: Every day | ORAL | 6 refills | Status: DC

## 2020-06-09 NOTE — Telephone Encounter (Signed)
Oral Oncology Patient Advocate Encounter  After completing a benefits investigation, prior authorization for Gleevec is not required at this time through [facility] plan).  Patient's copay is $3.00.  [place] Patient [place] Phone [phone] Fax [phone] 06/09/2020 3:38 PM

## 2020-06-09 NOTE — Telephone Encounter (Signed)
appts made/printed for 07/12/20 per 06/09/20 los... AOM

## 2020-06-09 NOTE — Progress Notes (Signed)
Hematology and Oncology Follow Up Visit  Loella Hickle [id number] [birth year] 74 y.o. 06/09/2020   Principle Diagnosis:   Stage IIIA  GIST of the stomach  Current Therapy:    Gleevec 400 mg po q day -- start on 06/10/2020     Interim History:  Ms. Asbill is back for follow-up.  We first saw her back in early October.  At that time, she had a stage IIIa GIST of the stomach.  This was resected.  It was somewhat high-grade.  We talked to her about using adjuvant chemotherapy with Gleevec.  She wanted to think about this.  She is feeling well.  There is no abdominal pain.  She is having no problems with nausea or vomiting.  There is no bleeding.  She has had no change in bowel or bladder habits.  There is no melena or hematochezia.  She has had no fever.  There is no cough.  She has had no leg swelling.  There is no facial swelling.  Her appetite is good.  She comes in with a daughter.  As always, we have good fellowship.  Currently, her performance status is ECOG 1.  Medications:  Current Outpatient Medications:  .  acetaminophen (TYLENOL) 325 MG tablet, Take 2 tablets (650 mg total) by mouth every 6 (six) hours as needed for mild pain, fever or headache., Disp: , Rfl:  .  amLODipine (NORVASC) 5 MG tablet, Take 5 mg by mouth daily., Disp: , Rfl:  .  atorvastatin (LIPITOR) 20 MG tablet, Take 20 mg by mouth at bedtime., Disp: , Rfl:  .  fluticasone (FLONASE) 50 MCG/ACT nasal spray, Place 1 spray into both nostrils daily as needed for allergies or rhinitis. , Disp: , Rfl:  .  losartan (COZAAR) 100 MG tablet, Take 100 mg by mouth daily., Disp: , Rfl:  .  metoprolol (LOPRESSOR) 100 MG tablet, Take 100 mg by mouth 2 (two) times daily., Disp: , Rfl:  .  imatinib (GLEEVEC) 400 MG tablet, Take 1 tablet (400 mg total) by mouth daily. Take with meals and large glass of water.Caution:Chemotherapy., Disp: 30 tablet, Rfl: 6 .  ondansetron (ZOFRAN) 4 MG tablet, Take 1 tablet (4 mg total) by mouth  every 6 (six) hours as needed for nausea. (Patient not taking: Reported on 06/09/2020), Disp: 20 tablet, Rfl: 0 .  pantoprazole (PROTONIX) 40 MG tablet, Take 1 tablet (40 mg total) by mouth 2 (two) times daily for 14 days, THEN 1 tablet (40 mg total) daily. (Patient taking differently: Take one tablet (40mg ) once daily.), Disp: 58 tablet, Rfl: 0 .  vitamin C (ASCORBIC ACID) 250 MG tablet, Take 1 tablet (250 mg total) by mouth 2 (two) times daily. (Patient not taking: Reported on 06/09/2020), Disp: , Rfl:   Allergies: No Known Allergies  Past Medical History, Surgical history, Social history, and Family History were reviewed and updated.  Review of Systems: Review of Systems  Constitutional: Negative.   HENT:  Negative.   Eyes: Negative.   Respiratory: Negative.   Cardiovascular: Negative.   Gastrointestinal: Negative.   Endocrine: Negative.   Genitourinary: Negative.    Musculoskeletal: Negative.   Skin: Negative.   Neurological: Negative.   Hematological: Negative.   Psychiatric/Behavioral: Negative.     Physical Exam:  weight is 128 lb 12.8 oz (58.4 kg). Her oral temperature is 97.8 F (36.6 C). Her blood pressure is 167/82 (abnormal) and her pulse is 48 (abnormal). Her respiration is 20 and oxygen saturation is 100%.  Wt Readings from Last 3 Encounters:  06/09/20 128 lb 12.8 oz (58.4 kg)  05/05/20 127 lb 12 oz (57.9 kg)  03/29/20 129 lb 6.6 oz (58.7 kg)    Physical Exam Vitals reviewed.  HENT:     Head: Normocephalic and atraumatic.  Eyes:     Pupils: Pupils are equal, round, and reactive to light.  Cardiovascular:     Rate and Rhythm: Normal rate and regular rhythm.     Heart sounds: Normal heart sounds.  Pulmonary:     Effort: Pulmonary effort is normal.     Breath sounds: Normal breath sounds.  Abdominal:     General: Bowel sounds are normal.     Palpations: Abdomen is soft.  Musculoskeletal:        General: No tenderness or deformity. Normal range of motion.      Cervical back: Normal range of motion.  Lymphadenopathy:     Cervical: No cervical adenopathy.  Skin:    General: Skin is warm and dry.     Findings: No erythema or rash.  Neurological:     Mental Status: She is alert and oriented to person, place, and time.  Psychiatric:        Behavior: Behavior normal.        Thought Content: Thought content normal.        Judgment: Judgment normal.      Lab Results  Component Value Date   WBC 6.0 06/09/2020   HGB 12.0 06/09/2020   HCT 39.2 06/09/2020   MCV 80.8 06/09/2020   PLT 123 (L) 06/09/2020     Chemistry      Component Value Date/Time   NA 139 06/09/2020 1346   K 3.6 06/09/2020 1346   CL 101 06/09/2020 1346   CO2 32 06/09/2020 1346   BUN 11 06/09/2020 1346   CREATININE 1.03 (H) 06/09/2020 1346      Component Value Date/Time   CALCIUM 9.6 06/09/2020 1346   ALKPHOS 64 06/09/2020 1346   AST 24 06/09/2020 1346   ALT 14 06/09/2020 1346   BILITOT 0.6 06/09/2020 1346      Impression and Plan: Ms. Haertel is a a very charming 74 year old African female from [place].  She has a resected stomach GIST.  This was high-grade.  It would be considered stage IIIa.  She has agreed to the [facility]  I went over the [place] side effects.  I told her about the fluid retention.  I told her to let us know she has any leg swelling, shortness of breath, swelling about the face.  She may also have some diarrhea.  I really think that she should do well with the [place].  We will have her on Gleevec, if she tolerates it, for I think 2 years.  I would like to get her back to see [place] in another month or so just to make sure that everything is doing okay.  I probably would not check another CT scan on her probably until the winter or early spring 2022.   [name], MD 11/10/20213:40 PM

## 2020-06-10 LAB — IRON AND TIBC
Iron: 130 ug/dL (ref 41–142)
Saturation Ratios: 38 % (ref 21–57)
TIBC: 345 ug/dL (ref 236–444)
UIBC: 216 ug/dL (ref 120–384)

## 2020-06-10 LAB — FERRITIN: Ferritin: 69 ng/mL (ref 11–307)

## 2020-06-11 ENCOUNTER — Telehealth: Payer: Self-pay | Admitting: Pharmacist

## 2020-06-11 MED FILL — IMATINIB MESYLATE 400 MG TA: 400 | 30 days supply | Qty: 30 | Fill #0

## 2020-06-11 NOTE — Telephone Encounter (Signed)
Oral Oncology Patient Advocate Encounter  I spoke with Lori Rush this morning to set up delivery of Gleevec (Imatinib).  Address verified for shipment.  Gleevec will be filled through [facility] and mailed 06/11/20 for delivery 06/14/20.    [place] will call 7-10 days before next refill is due to complete adherence call and set up delivery of medication.     [place] Patient [place] Phone [phone] Fax [phone] 06/11/2020 10:45 AM

## 2020-06-11 NOTE — Telephone Encounter (Signed)
Oral Oncology Pharmacist Encounter  Received new prescription for Gleevec (imatinib) for the adjuvant treatment of GIST (stage IIIA, resected), planned duration of 2 years.  CMP from 06/09/20 assessed, slight renal impairment continue to monitor. Prescription dose and frequency assessed.   Current medication list in Epic reviewed, a few DDIs with imatinib identified: -Imatinib may increase the concentration of amlodipine. Monitor blood pressure. No baseline dose adjustment needed. -Imatinib may increase the concentration of atorvastatin. Monitor patient for myopathy and rhabdomyolysis. No baseline dose adjustment needed.  Evaluated chart and no patient barriers to medication adherence identified.   Prescription has been e-scribed to the [facility] for benefits analysis and approval.  Oral Oncology Clinic will continue to follow for insurance authorization, copayment issues, initial counseling and start date.  Darl Pikes, PharmD, BCPS, BCOP, CPP Hematology/Oncology Clinical Pharmacist Practitioner [facility] [phone]  06/11/2020 9:11 AM

## 2020-06-15 NOTE — Telephone Encounter (Signed)
Oral Chemotherapy Pharmacist Encounter  Patient Education I spoke with patient on 06/11/20 for overview of new oral chemotherapy medication: Gleevec (imatinib) for the adjuvant treatment of GIST (stage IIIA, resected), planned duration of 2 years.  Pt is doing well. Counseled patient on administration, dosing, side effects, monitoring, drug-food interactions, safe handling, storage, and disposal. Patient will take 1 tablet (400 mg total) by mouth daily. Take with meals and large glass of water.  Side effects include but not limited to: decreased wbc/hgb/plt, diarrhea, rash, edema and fatigue.    Reviewed with patient importance of keeping a medication schedule and plan for any missed doses.  After discussion with patient no patient barriers to medication adherence identified.   Ms. Markwell voiced understanding and appreciation. All questions answered. Medication handout provided.  Provided patient with Oral [facility] phone number. Patient knows to call the office with questions or concerns. Oral Chemotherapy Navigation Clinic will continue to follow.  Darl Pikes, PharmD, BCPS, BCOP, CPP Hematology/Oncology Clinical Pharmacist Practitioner [facility] [phone]  06/15/2020 9:18 AM

## 2020-07-12 ENCOUNTER — Inpatient Hospital Stay (HOSPITAL_BASED_OUTPATIENT_CLINIC_OR_DEPARTMENT_OTHER): Payer: Medicaid Other | Admitting: Hematology & Oncology

## 2020-07-12 ENCOUNTER — Encounter: Payer: Self-pay | Admitting: Hematology & Oncology

## 2020-07-12 ENCOUNTER — Inpatient Hospital Stay: Payer: Medicaid Other | Attending: Hematology & Oncology

## 2020-07-12 ENCOUNTER — Other Ambulatory Visit: Payer: Self-pay

## 2020-07-12 VITALS — BP 152/85 | HR 50 | Temp 98.2°F | Resp 18 | Wt 134.0 lb

## 2020-07-12 DIAGNOSIS — C49A2 Gastrointestinal stromal tumor of stomach: Secondary | ICD-10-CM

## 2020-07-12 DIAGNOSIS — Z79899 Other long term (current) drug therapy: Secondary | ICD-10-CM | POA: Diagnosis not present

## 2020-07-12 LAB — CBC WITH DIFFERENTIAL (CANCER CENTER ONLY)
Abs Immature Granulocytes: 0.02 10*3/uL (ref 0.00–0.07)
Basophils Absolute: 0 10*3/uL (ref 0.0–0.1)
Basophils Relative: 1 %
Eosinophils Absolute: 0.2 10*3/uL (ref 0.0–0.5)
Eosinophils Relative: 3 %
HCT: 42 % (ref 36.0–46.0)
Hemoglobin: 12.1 g/dL (ref 12.0–15.0)
Immature Granulocytes: 1 %
Lymphocytes Relative: 40 %
Lymphs Abs: 1.8 10*3/uL (ref 0.7–4.0)
MCH: 24.5 pg — ABNORMAL LOW (ref 26.0–34.0)
MCHC: 29.9 g/dL — ABNORMAL LOW (ref 30.0–36.0)
MCV: 81.9 fL (ref 80.0–100.0)
Monocytes Absolute: 0.5 10*3/uL (ref 0.1–1.0)
Monocytes Relative: 11 %
Neutro Abs: 2 10*3/uL (ref 1.7–7.7)
Neutrophils Relative %: 44 %
Platelet Count: 173 10*3/uL (ref 150–400)
RBC: 5.26 MIL/uL — ABNORMAL HIGH (ref 3.87–5.11)
RDW: 14.9 % (ref 11.5–15.5)
WBC Count: 4.4 10*3/uL (ref 4.0–10.5)
nRBC: 0 % (ref 0.0–0.2)

## 2020-07-12 LAB — CMP (CANCER CENTER ONLY)
ALT: 30 U/L (ref 0–44)
AST: 31 U/L (ref 15–41)
Albumin: 4.3 g/dL (ref 3.5–5.0)
Alkaline Phosphatase: 91 U/L (ref 38–126)
Anion gap: 7 (ref 5–15)
BUN: 12 mg/dL (ref 8–23)
CO2: 32 mmol/L (ref 22–32)
Calcium: 9.5 mg/dL (ref 8.9–10.3)
Chloride: 102 mmol/L (ref 98–111)
Creatinine: 1.14 mg/dL — ABNORMAL HIGH (ref 0.44–1.00)
GFR, Estimated: 50 mL/min — ABNORMAL LOW (ref >60)
Glucose, Bld: 87 mg/dL (ref 70–99)
Potassium: 4.2 mmol/L (ref 3.5–5.1)
Sodium: 141 mmol/L (ref 135–145)
Total Bilirubin: 0.6 mg/dL (ref 0.3–1.2)
Total Protein: 7.6 g/dL (ref 6.5–8.1)

## 2020-07-12 LAB — LACTATE DEHYDROGENASE: LDH: 225 U/L — ABNORMAL HIGH (ref 98–192)

## 2020-07-12 LAB — SAVE SMEAR(SSMR), FOR PROVIDER SLIDE REVIEW

## 2020-07-12 MED ORDER — TRIAMTERENE-HCTZ 75-50 MG PO TABS: 1.0000 | ORAL_TABLET | Freq: Every day | ORAL | 3 refills | Status: DC

## 2020-07-12 NOTE — Progress Notes (Signed)
Hematology and Oncology Follow Up Visit  Lori Rush [id number] Jun 09, 1946 74 y.o. 07/12/2020   Principle Diagnosis:   Stage IIIA  GIST of the stomach  Current Therapy:    Gleevec 400 mg po q day -- start on 07/03/2020     Interim History:  Lori Rush is back for follow-up.  Overall, Lori Rush is about the same.  Lori Rush seems to be doing pretty well.  Lori Rush started the [place] a week ago.  Lori Rush has had a little bit of swelling in Lori Rush joints.  Lori Rush blood pressure is up a little bit so we will start Lori Rush on a diuretic to see if this may help.  Lori Rush has had no problems with bleeding.  There is no diarrhea.  Lori Rush has no nausea or vomiting.  There is no cough or shortness of breath.  Lori Rush and Lori Rush family had a nice Thanksgiving.  There has been no rashes.  Overall, I would have to say that Lori Rush performance status is ECOG 1.    Medications:  Current Outpatient Medications:  .  amLODipine (NORVASC) 5 MG tablet, Take 5 mg by mouth daily., Disp: , Rfl:  .  atorvastatin (LIPITOR) 20 MG tablet, Take 20 mg by mouth at bedtime., Disp: , Rfl:  .  fluticasone (FLONASE) 50 MCG/ACT nasal spray, Place 1 spray into both nostrils daily as needed for allergies or rhinitis. , Disp: , Rfl:  .  imatinib (GLEEVEC) 400 MG tablet, Take 1 tablet (400 mg total) by mouth daily. Take with meals and large glass of water.Caution:Chemotherapy., Disp: 30 tablet, Rfl: 6 .  losartan (COZAAR) 100 MG tablet, Take 100 mg by mouth daily., Disp: , Rfl:  .  metoprolol (LOPRESSOR) 100 MG tablet, Take 100 mg by mouth 2 (two) times daily., Disp: , Rfl:  .  vitamin C (ASCORBIC ACID) 250 MG tablet, Take 1 tablet (250 mg total) by mouth 2 (two) times daily., Disp: , Rfl:  .  acetaminophen (TYLENOL) 325 MG tablet, Take 2 tablets (650 mg total) by mouth every 6 (six) hours as needed for mild pain, fever or headache. (Patient not taking: Reported on 07/12/2020), Disp: , Rfl:  .  ondansetron (ZOFRAN) 4 MG tablet, Take 1 tablet (4 mg total) by mouth  every 6 (six) hours as needed for nausea. (Patient not taking: No sig reported), Disp: 20 tablet, Rfl: 0 .  pantoprazole (PROTONIX) 40 MG tablet, Take 1 tablet (40 mg total) by mouth 2 (two) times daily for 14 days, THEN 1 tablet (40 mg total) daily. (Patient taking differently: Take one tablet (40mg ) once daily.), Disp: 58 tablet, Rfl: 0  Allergies: No Known Allergies  Past Medical History, Surgical history, Social history, and Family History were reviewed and updated.  Review of Systems: Review of Systems  Constitutional: Negative.   HENT:  Negative.   Eyes: Negative.   Respiratory: Negative.   Cardiovascular: Negative.   Gastrointestinal: Negative.   Endocrine: Negative.   Genitourinary: Negative.    Musculoskeletal: Negative.   Skin: Negative.   Neurological: Negative.   Hematological: Negative.   Psychiatric/Behavioral: Negative.     Physical Exam:  weight is 134 lb (60.8 kg). Lori Rush oral temperature is 98.2 F (36.8 C). Lori Rush blood pressure is 152/85 (abnormal) and Lori Rush pulse is 50 (abnormal). Lori Rush respiration is 18 and oxygen saturation is 100%.   Wt Readings from Last 3 Encounters:  07/12/20 134 lb (60.8 kg)  06/09/20 128 lb 12.8 oz (58.4 kg)  05/05/20 127 lb 12 oz (57.9 kg)  Physical Exam Vitals reviewed.  HENT:     Head: Normocephalic and atraumatic.  Eyes:     Pupils: Pupils are equal, round, and reactive to light.  Cardiovascular:     Rate and Rhythm: Normal rate and regular rhythm.     Heart sounds: Normal heart sounds.  Pulmonary:     Effort: Pulmonary effort is normal.     Breath sounds: Normal breath sounds.  Abdominal:     General: Bowel sounds are normal.     Palpations: Abdomen is soft.  Musculoskeletal:        General: No tenderness or deformity. Normal range of motion.     Cervical back: Normal range of motion.  Lymphadenopathy:     Cervical: No cervical adenopathy.  Skin:    General: Skin is warm and dry.     Findings: No erythema or rash.   Neurological:     Mental Status: Lori Rush is alert and oriented to person, place, and time.  Psychiatric:        Behavior: Behavior normal.        Thought Content: Thought content normal.        Judgment: Judgment normal.    Lab Results  Component Value Date   WBC 4.4 07/12/2020   HGB 12.1 07/12/2020   HCT 42.0 07/12/2020   MCV 81.9 07/12/2020   PLT 173 07/12/2020     Chemistry      Component Value Date/Time   NA 139 06/09/2020 1346   K 3.6 06/09/2020 1346   CL 101 06/09/2020 1346   CO2 32 06/09/2020 1346   BUN 11 06/09/2020 1346   CREATININE 1.03 (H) 06/09/2020 1346      Component Value Date/Time   CALCIUM 9.6 06/09/2020 1346   ALKPHOS 64 06/09/2020 1346   AST 24 06/09/2020 1346   ALT 14 06/09/2020 1346   BILITOT 0.6 06/09/2020 1346      Impression and Plan: Lori Rush is a a very charming 74 year old African female from [place].  Lori Rush has a resected stomach GIST.  This was high-grade.  It would be considered stage IIIa.  Lori Rush has finally started Gleevec.  Again I am not sure as to exactly what side effects Lori Rush has had a.  Again we can start Lori Rush on a diuretic to see this may help some of the swelling.  It is way too early for [place] to do any type of scan.  I would like to get Lori Rush back in about 6 weeks.  At that time, we will see how Lori Rush is doing and how Lori Rush joint swelling is.   [name], MD 12/13/20213:13 PM

## 2020-08-04 MED FILL — IMATINIB MESYLATE 400 MG TA: 400 | 30 days supply | Qty: 30 | Fill #1

## 2020-08-13 ENCOUNTER — Telehealth: Payer: Self-pay

## 2020-08-13 NOTE — Telephone Encounter (Signed)
Returned pts call to r/s her appt from 1/17 due to weather    aom

## 2020-08-16 ENCOUNTER — Other Ambulatory Visit: Payer: Medicaid Other

## 2020-08-16 ENCOUNTER — Ambulatory Visit: Payer: Medicaid Other | Admitting: Hematology & Oncology

## 2020-08-30 ENCOUNTER — Telehealth: Payer: Self-pay

## 2020-08-30 ENCOUNTER — Inpatient Hospital Stay (HOSPITAL_BASED_OUTPATIENT_CLINIC_OR_DEPARTMENT_OTHER): Payer: Medicaid Other | Admitting: Hematology & Oncology

## 2020-08-30 ENCOUNTER — Other Ambulatory Visit: Payer: Self-pay

## 2020-08-30 ENCOUNTER — Encounter: Payer: Self-pay | Admitting: Hematology & Oncology

## 2020-08-30 ENCOUNTER — Inpatient Hospital Stay: Payer: Medicaid Other | Attending: Hematology & Oncology

## 2020-08-30 VITALS — BP 157/70 | HR 50 | Temp 98.1°F | Resp 17 | Wt 133.5 lb

## 2020-08-30 DIAGNOSIS — C49A2 Gastrointestinal stromal tumor of stomach: Secondary | ICD-10-CM

## 2020-08-30 DIAGNOSIS — Z79899 Other long term (current) drug therapy: Secondary | ICD-10-CM | POA: Diagnosis not present

## 2020-08-30 LAB — CMP (CANCER CENTER ONLY)
ALT: 22 U/L (ref 0–44)
AST: 27 U/L (ref 15–41)
Albumin: 4.3 g/dL (ref 3.5–5.0)
Alkaline Phosphatase: 80 U/L (ref 38–126)
Anion gap: 7 (ref 5–15)
BUN: 20 mg/dL (ref 8–23)
CO2: 31 mmol/L (ref 22–32)
Calcium: 9.9 mg/dL (ref 8.9–10.3)
Chloride: 95 mmol/L — ABNORMAL LOW (ref 98–111)
Creatinine: 1.25 mg/dL — ABNORMAL HIGH (ref 0.44–1.00)
GFR, Estimated: 45 mL/min — ABNORMAL LOW (ref >60)
Glucose, Bld: 111 mg/dL — ABNORMAL HIGH (ref 70–99)
Potassium: 4.2 mmol/L (ref 3.5–5.1)
Sodium: 133 mmol/L — ABNORMAL LOW (ref 135–145)
Total Bilirubin: 0.8 mg/dL (ref 0.3–1.2)
Total Protein: 7.9 g/dL (ref 6.5–8.1)

## 2020-08-30 LAB — CBC WITH DIFFERENTIAL (CANCER CENTER ONLY)
Abs Immature Granulocytes: 0.01 10*3/uL (ref 0.00–0.07)
Basophils Absolute: 0 10*3/uL (ref 0.0–0.1)
Basophils Relative: 1 %
Eosinophils Absolute: 0.1 10*3/uL (ref 0.0–0.5)
Eosinophils Relative: 3 %
HCT: 37.7 % (ref 36.0–46.0)
Hemoglobin: 11.6 g/dL — ABNORMAL LOW (ref 12.0–15.0)
Immature Granulocytes: 0 %
Lymphocytes Relative: 32 %
Lymphs Abs: 1.4 10*3/uL (ref 0.7–4.0)
MCH: 24.1 pg — ABNORMAL LOW (ref 26.0–34.0)
MCHC: 30.8 g/dL (ref 30.0–36.0)
MCV: 78.2 fL — ABNORMAL LOW (ref 80.0–100.0)
Monocytes Absolute: 0.5 10*3/uL (ref 0.1–1.0)
Monocytes Relative: 13 %
Neutro Abs: 2.2 10*3/uL (ref 1.7–7.7)
Neutrophils Relative %: 51 %
Platelet Count: 175 10*3/uL (ref 150–400)
RBC: 4.82 MIL/uL (ref 3.87–5.11)
RDW: 14.4 % (ref 11.5–15.5)
WBC Count: 4.3 10*3/uL (ref 4.0–10.5)
nRBC: 0 % (ref 0.0–0.2)

## 2020-08-30 LAB — LACTATE DEHYDROGENASE: LDH: 198 U/L — ABNORMAL HIGH (ref 98–192)

## 2020-08-30 NOTE — Progress Notes (Signed)
Hematology and Oncology Follow Up Visit  Lori Rush [id number] Feb 05, 1946 75 y.o. 08/30/2020   Principle Diagnosis:   Stage IIIA  GIST of the stomach  Current Therapy:    Gleevec 400 mg po q day -- start on 07/03/2020     Interim History:  Lori Rush is back for follow-up.  Overall, she is about the same.  Looks like she may have some swelling in the right knee.  I told her that it just did not think this was from [facility].  She has little bit of fluid there on exam.  I think she [place] see orthopedic surgery.  Unfortunately she does not have 1.  I will have to see if we can get her to see Lori Rush.  Otherwise, she seems to be doing pretty well with the [place].  There is no cough or shortness of breath.  She has had no swelling around the face.  She has had no fever.  She has had no change in bowel or bladder habits.  There is been no bleeding.  She has had no swelling in the ankles.  Overall, I would have to say that her performance status is probably ECOG 2.    Medications:  Current Outpatient Medications:  .  acetaminophen (TYLENOL) 325 MG tablet, Take 2 tablets (650 mg total) by mouth every 6 (six) hours as needed for mild pain, fever or headache. (Patient not taking: Reported on 07/12/2020), Disp: , Rfl:  .  amLODipine (NORVASC) 5 MG tablet, Take 5 mg by mouth daily., Disp: , Rfl:  .  atorvastatin (LIPITOR) 20 MG tablet, Take 20 mg by mouth at bedtime., Disp: , Rfl:  .  fluticasone (FLONASE) 50 MCG/ACT nasal spray, Place 1 spray into both nostrils daily as needed for allergies or rhinitis. , Disp: , Rfl:  .  imatinib (GLEEVEC) 400 MG tablet, Take 1 tablet (400 mg total) by mouth daily. Take with meals and large glass of water.Caution:Chemotherapy., Disp: 30 tablet, Rfl: 6 .  losartan (COZAAR) 100 MG tablet, Take 100 mg by mouth daily., Disp: , Rfl:  .  metoprolol (LOPRESSOR) 100 MG tablet, Take 100 mg by mouth 2 (two) times daily., Disp: , Rfl:  .  ondansetron (ZOFRAN) 4 MG  tablet, Take 1 tablet (4 mg total) by mouth every 6 (six) hours as needed for nausea. (Patient not taking: No sig reported), Disp: 20 tablet, Rfl: 0 .  pantoprazole (PROTONIX) 40 MG tablet, Take 1 tablet (40 mg total) by mouth 2 (two) times daily for 14 days, THEN 1 tablet (40 mg total) daily. (Patient taking differently: Take one tablet (40mg ) once daily.), Disp: 58 tablet, Rfl: 0 .  triamterene-hydrochlorothiazide (MAXZIDE) 75-50 MG tablet, Take 1 tablet by mouth daily., Disp: 30 tablet, Rfl: 3 .  vitamin C (ASCORBIC ACID) 250 MG tablet, Take 1 tablet (250 mg total) by mouth 2 (two) times daily., Disp: , Rfl:   Allergies: No Known Allergies  Past Medical History, Surgical history, Social history, and Family History were reviewed and updated.  Review of Systems: Review of Systems  Constitutional: Negative.   HENT:  Negative.   Eyes: Negative.   Respiratory: Negative.   Cardiovascular: Negative.   Gastrointestinal: Negative.   Endocrine: Negative.   Genitourinary: Negative.    Musculoskeletal: Negative.   Skin: Negative.   Neurological: Negative.   Hematological: Negative.   Psychiatric/Behavioral: Negative.     Physical Exam:  weight is 133 lb 8 oz (60.6 kg). Her oral temperature is 98.1  F (36.7 C). Her blood pressure is 157/70 (abnormal) and her pulse is 50 (abnormal). Her respiration is 17 and oxygen saturation is 100%.   Wt Readings from Last 3 Encounters:  08/30/20 133 lb 8 oz (60.6 kg)  07/12/20 134 lb (60.8 kg)  06/09/20 128 lb 12.8 oz (58.4 kg)    Physical Exam Vitals reviewed.  HENT:     Head: Normocephalic and atraumatic.  Eyes:     Pupils: Pupils are equal, round, and reactive to light.  Cardiovascular:     Rate and Rhythm: Normal rate and regular rhythm.     Heart sounds: Normal heart sounds.  Pulmonary:     Effort: Pulmonary effort is normal.     Breath sounds: Normal breath sounds.  Abdominal:     General: Bowel sounds are normal.     Palpations:  Abdomen is soft.  Musculoskeletal:        General: No tenderness or deformity. Normal range of motion.     Cervical back: Normal range of motion.  Lymphadenopathy:     Cervical: No cervical adenopathy.  Skin:    General: Skin is warm and dry.     Findings: No erythema or rash.  Neurological:     Mental Status: She is alert and oriented to person, place, and time.  Psychiatric:        Behavior: Behavior normal.        Thought Content: Thought content normal.        Judgment: Judgment normal.    Lab Results  Component Value Date   WBC 4.3 08/30/2020   HGB 11.6 (L) 08/30/2020   HCT 37.7 08/30/2020   MCV 78.2 (L) 08/30/2020   PLT 175 08/30/2020     Chemistry      Component Value Date/Time   NA 133 (L) 08/30/2020 1025   K 4.2 08/30/2020 1025   CL 95 (L) 08/30/2020 1025   CO2 31 08/30/2020 1025   BUN 20 08/30/2020 1025   CREATININE 1.25 (H) 08/30/2020 1025      Component Value Date/Time   CALCIUM 9.9 08/30/2020 1025   ALKPHOS 80 08/30/2020 1025   AST 27 08/30/2020 1025   ALT 22 08/30/2020 1025   BILITOT 0.8 08/30/2020 1025      Impression and Plan: Lori Rush is a a very charming 75 year old African female from [place].  She has a resected stomach GIST.  This was high-grade.  It would be considered stage IIIa.  She is doing pretty well.  Again, I really do not see that we have any issues with respect to recurrent GIST.  At some point, we probably will have to scan her.  We will have to see if orthopedic surgery can help with the right knee.  I have not done any x-rays on this.  I would like to get her back in 6 weeks.  By then, she should have seen orthopedic surgery.    [name], MD 1/31/202211:42 AM

## 2020-08-30 NOTE — Telephone Encounter (Signed)
appts made and printed for pt per 08/30/20 los

## 2020-08-31 MED FILL — IMATINIB MESYLATE 400 MG TA: 400 | 30 days supply | Qty: 30 | Fill #2

## 2020-09-24 MED FILL — IMATINIB MESYLATE 400 MG TA: 400 | 30 days supply | Qty: 30 | Fill #3

## 2020-10-11 ENCOUNTER — Inpatient Hospital Stay: Payer: Medicaid Other | Attending: Hematology & Oncology

## 2020-10-11 ENCOUNTER — Encounter: Payer: Self-pay | Admitting: Hematology & Oncology

## 2020-10-11 ENCOUNTER — Other Ambulatory Visit: Payer: Self-pay

## 2020-10-11 ENCOUNTER — Inpatient Hospital Stay (HOSPITAL_BASED_OUTPATIENT_CLINIC_OR_DEPARTMENT_OTHER): Payer: Medicaid Other | Admitting: Hematology & Oncology

## 2020-10-11 VITALS — BP 166/85 | HR 51 | Temp 98.2°F | Resp 20 | Wt 135.1 lb

## 2020-10-11 DIAGNOSIS — Z79899 Other long term (current) drug therapy: Secondary | ICD-10-CM | POA: Diagnosis not present

## 2020-10-11 DIAGNOSIS — C49A2 Gastrointestinal stromal tumor of stomach: Secondary | ICD-10-CM

## 2020-10-11 DIAGNOSIS — M25461 Effusion, right knee: Secondary | ICD-10-CM | POA: Diagnosis not present

## 2020-10-11 LAB — CMP (CANCER CENTER ONLY)
ALT: 17 U/L (ref 0–44)
AST: 21 U/L (ref 15–41)
Albumin: 4 g/dL (ref 3.5–5.0)
Alkaline Phosphatase: 72 U/L (ref 38–126)
Anion gap: 5 (ref 5–15)
BUN: 15 mg/dL (ref 8–23)
CO2: 32 mmol/L (ref 22–32)
Calcium: 9.3 mg/dL (ref 8.9–10.3)
Chloride: 102 mmol/L (ref 98–111)
Creatinine: 0.97 mg/dL (ref 0.44–1.00)
GFR, Estimated: >60 mL/min (ref >60)
Glucose, Bld: 90 mg/dL (ref 70–99)
Potassium: 3.7 mmol/L (ref 3.5–5.1)
Sodium: 139 mmol/L (ref 135–145)
Total Bilirubin: 0.5 mg/dL (ref 0.3–1.2)
Total Protein: 6.9 g/dL (ref 6.5–8.1)

## 2020-10-11 LAB — CBC WITH DIFFERENTIAL (CANCER CENTER ONLY)
Abs Immature Granulocytes: 0.01 10*3/uL (ref 0.00–0.07)
Basophils Absolute: 0 10*3/uL (ref 0.0–0.1)
Basophils Relative: 1 %
Eosinophils Absolute: 0.2 10*3/uL (ref 0.0–0.5)
Eosinophils Relative: 4 %
HCT: 35.5 % — ABNORMAL LOW (ref 36.0–46.0)
Hemoglobin: 10.9 g/dL — ABNORMAL LOW (ref 12.0–15.0)
Immature Granulocytes: 0 %
Lymphocytes Relative: 34 %
Lymphs Abs: 1.5 10*3/uL (ref 0.7–4.0)
MCH: 24.6 pg — ABNORMAL LOW (ref 26.0–34.0)
MCHC: 30.7 g/dL (ref 30.0–36.0)
MCV: 80.1 fL (ref 80.0–100.0)
Monocytes Absolute: 0.5 10*3/uL (ref 0.1–1.0)
Monocytes Relative: 12 %
Neutro Abs: 2.2 10*3/uL (ref 1.7–7.7)
Neutrophils Relative %: 49 %
Platelet Count: 185 10*3/uL (ref 150–400)
RBC: 4.43 MIL/uL (ref 3.87–5.11)
RDW: 14.5 % (ref 11.5–15.5)
WBC Count: 4.5 10*3/uL (ref 4.0–10.5)
nRBC: 0 % (ref 0.0–0.2)

## 2020-10-11 LAB — LACTATE DEHYDROGENASE: LDH: 194 U/L — ABNORMAL HIGH (ref 98–192)

## 2020-10-11 MED ORDER — AMLODIPINE BESYLATE 10 MG PO TABS: 10.0000 mg | ORAL_TABLET | Freq: Every day | ORAL | 4 refills | Status: DC

## 2020-10-11 NOTE — Progress Notes (Signed)
Hematology and Oncology Follow Up Visit  Yazlin Ekblad [id number] [birth year] 75 y.o. 10/11/2020   Principle Diagnosis:   Stage IIIA  GIST of the stomach  Current Therapy:    Gleevec 400 mg po q day -- start on 07/03/2020     Interim History:  Ms. Morace is back for follow-up.  She is worried about a bill that she got.  This was from the pathology department.  We will direct her to the right personnel to talk about this.  I told her that I just was not sure what the bill was for.  Looks like it had to do with when she had her surgery.  The right knee is still a problem for her.  She has not seen orthopedic surgery.  I doubt that she ever will see orthopedic surgery.  I am not sure if she is taking the Gleevec every day.  We definitely have to get her on for a CT scan.  I have to see what is going on.  She is not having any diarrhea.  There is no obvious leg swelling.  She has had no rashes.  She has had no nausea or vomiting.  She says that the pill just does make her feel that good on occasion.  She has had no fever.  She has had no bleeding.  Overall, performance status is ECOG 1.    Medications:  Current Outpatient Medications:  .  acetaminophen (TYLENOL) 325 MG tablet, Take 2 tablets (650 mg total) by mouth every 6 (six) hours as needed for mild pain, fever or headache., Disp: , Rfl:  .  atorvastatin (LIPITOR) 20 MG tablet, Take 20 mg by mouth at bedtime., Disp: , Rfl:  .  fluticasone (FLONASE) 50 MCG/ACT nasal spray, Place 1 spray into both nostrils daily as needed for allergies or rhinitis. , Disp: , Rfl:  .  imatinib (GLEEVEC) 400 MG tablet, Take 1 tablet (400 mg total) by mouth daily. Take with meals and large glass of water.Caution:Chemotherapy., Disp: 30 tablet, Rfl: 6 .  losartan (COZAAR) 100 MG tablet, Take 100 mg by mouth daily., Disp: , Rfl:  .  metoprolol (LOPRESSOR) 100 MG tablet, Take 100 mg by mouth 2 (two) times daily., Disp: , Rfl:  .  ondansetron (ZOFRAN) 4  MG tablet, Take 1 tablet (4 mg total) by mouth every 6 (six) hours as needed for nausea., Disp: 20 tablet, Rfl: 0 .  triamterene-hydrochlorothiazide (MAXZIDE) 75-50 MG tablet, Take 1 tablet by mouth daily., Disp: 30 tablet, Rfl: 3 .  vitamin C (ASCORBIC ACID) 250 MG tablet, Take 1 tablet (250 mg total) by mouth 2 (two) times daily. (Patient taking differently: Take 250 mg by mouth daily.), Disp: , Rfl:  .  amLODipine (NORVASC) 10 MG tablet, Take 1 tablet (10 mg total) by mouth daily., Disp: 30 tablet, Rfl: 4 .  pantoprazole (PROTONIX) 40 MG tablet, Take 1 tablet (40 mg total) by mouth 2 (two) times daily for 14 days, THEN 1 tablet (40 mg total) daily. (Patient taking differently: Take one tablet (40mg ) once daily.), Disp: 58 tablet, Rfl: 0  Allergies: No Known Allergies  Past Medical History, Surgical history, Social history, and Family History were reviewed and updated.  Review of Systems: Review of Systems  Constitutional: Negative.   HENT:  Negative.   Eyes: Negative.   Respiratory: Negative.   Cardiovascular: Negative.   Gastrointestinal: Negative.   Endocrine: Negative.   Genitourinary: Negative.    Musculoskeletal: Negative.   Skin: Negative.  Neurological: Negative.   Hematological: Negative.   Psychiatric/Behavioral: Negative.     Physical Exam:  weight is 135 lb 1.9 oz (61.3 kg). Her oral temperature is 98.2 F (36.8 C). Her blood pressure is 166/85 (abnormal) and her pulse is 51 (abnormal). Her respiration is 20 and oxygen saturation is 100%.   Wt Readings from Last 3 Encounters:  10/11/20 135 lb 1.9 oz (61.3 kg)  08/30/20 133 lb 8 oz (60.6 kg)  07/12/20 134 lb (60.8 kg)    Physical Exam Vitals reviewed.  HENT:     Head: Normocephalic and atraumatic.  Eyes:     Pupils: Pupils are equal, round, and reactive to light.  Cardiovascular:     Rate and Rhythm: Normal rate and regular rhythm.     Heart sounds: Normal heart sounds.  Pulmonary:     Effort: Pulmonary  effort is normal.     Breath sounds: Normal breath sounds.  Abdominal:     General: Bowel sounds are normal.     Palpations: Abdomen is soft.  Musculoskeletal:        General: No tenderness or deformity. Normal range of motion.     Cervical back: Normal range of motion.  Lymphadenopathy:     Cervical: No cervical adenopathy.  Skin:    General: Skin is warm and dry.     Findings: No erythema or rash.  Neurological:     Mental Status: She is alert and oriented to person, place, and time.  Psychiatric:        Behavior: Behavior normal.        Thought Content: Thought content normal.        Judgment: Judgment normal.    Lab Results  Component Value Date   WBC 4.5 10/11/2020   HGB 10.9 (L) 10/11/2020   HCT 35.5 (L) 10/11/2020   MCV 80.1 10/11/2020   PLT 185 10/11/2020     Chemistry      Component Value Date/Time   NA 139 10/11/2020 1452   K 3.7 10/11/2020 1452   CL 102 10/11/2020 1452   CO2 32 10/11/2020 1452   BUN 15 10/11/2020 1452   CREATININE 0.97 10/11/2020 1452      Component Value Date/Time   CALCIUM 9.3 10/11/2020 1452   ALKPHOS 72 10/11/2020 1452   AST 21 10/11/2020 1452   ALT 17 10/11/2020 1452   BILITOT 0.5 10/11/2020 1452      Impression and Plan: Ms. Penny is a a very charming 75 year old African female from [place].  She has a resected stomach GIST.  This was done in August 2021.  This was high-grade.  It would be considered stage IIIa.  I will set her up with a CT scan.  I think this will be very helpful for [place].  I am going to increase her amlodipine to 10 mg a day.  I think she needs this.  Her blood pressure is still on the high side.  She still is limping because of the right knee.  She still has swelling in the right knee.  Again I still think she will ever see orthopedic surgery.  I hope that this bill issue gets settled.  I know she is quite worried about this.  I will plan to get her back in another 3 months.  We will do the CT scan the same  day that I see her.   [name], MD 3/14/20225:08 PM

## 2020-10-12 ENCOUNTER — Telehealth: Payer: Self-pay

## 2020-10-12 ENCOUNTER — Other Ambulatory Visit: Payer: Self-pay | Admitting: Hematology & Oncology

## 2020-10-12 NOTE — Telephone Encounter (Signed)
Called and left a vm with appts per 10/11/20 los, also advised that her ct scan would be sch after approval    Hank Walling

## 2020-10-20 MED FILL — IMATINIB MESYLATE 400 MG TA: 400 | 30 days supply | Qty: 30 | Fill #4

## 2020-10-28 ENCOUNTER — Other Ambulatory Visit (HOSPITAL_COMMUNITY): Payer: Self-pay

## 2020-11-15 ENCOUNTER — Other Ambulatory Visit (HOSPITAL_COMMUNITY): Payer: Self-pay

## 2020-11-15 MED FILL — Imatinib Mesylate Tab 400 MG (Base Equivalent): ORAL | 30 days supply | Qty: 30 | Fill #0 | Status: AC

## 2020-11-17 ENCOUNTER — Other Ambulatory Visit (HOSPITAL_COMMUNITY): Payer: Self-pay

## 2020-12-13 ENCOUNTER — Other Ambulatory Visit (HOSPITAL_COMMUNITY): Payer: Self-pay

## 2020-12-13 MED FILL — Imatinib Mesylate Tab 400 MG (Base Equivalent): ORAL | 30 days supply | Qty: 30 | Fill #1 | Status: AC

## 2020-12-14 ENCOUNTER — Telehealth: Payer: Self-pay

## 2020-12-14 ENCOUNTER — Other Ambulatory Visit (HOSPITAL_COMMUNITY): Payer: Self-pay

## 2020-12-14 NOTE — Telephone Encounter (Signed)
S/w pt regarding moving her June appts to aug due to contrast shortage, teams message to imaging to move her appt, pt aware that I will mail a calendar when all appts moved   Lori Rush

## 2021-01-10 ENCOUNTER — Other Ambulatory Visit: Payer: Self-pay | Admitting: Hematology & Oncology

## 2021-01-10 ENCOUNTER — Other Ambulatory Visit (HOSPITAL_COMMUNITY): Payer: Self-pay

## 2021-01-10 MED ORDER — IMATINIB MESYLATE 400 MG PO TABS
ORAL_TABLET | ORAL | 6 refills | Status: DC
  Filled 2021-01-10: qty 30, 30d supply, fill #0
  Filled 2021-02-07: qty 30, 30d supply, fill #1
  Filled 2021-03-10: qty 30, 30d supply, fill #2
  Filled 2021-04-06: qty 30, 30d supply, fill #3
  Filled 2021-05-02: qty 30, 30d supply, fill #4
  Filled 2021-06-06: qty 30, 30d supply, fill #5
  Filled 2021-07-06: qty 30, 30d supply, fill #6

## 2021-01-12 ENCOUNTER — Other Ambulatory Visit (HOSPITAL_COMMUNITY): Payer: Self-pay

## 2021-01-19 ENCOUNTER — Ambulatory Visit: Payer: Medicaid Other | Admitting: Hematology & Oncology

## 2021-01-19 ENCOUNTER — Other Ambulatory Visit (HOSPITAL_BASED_OUTPATIENT_CLINIC_OR_DEPARTMENT_OTHER): Payer: Medicaid Other

## 2021-01-19 ENCOUNTER — Other Ambulatory Visit: Payer: Medicaid Other

## 2021-02-07 ENCOUNTER — Other Ambulatory Visit (HOSPITAL_COMMUNITY): Payer: Self-pay

## 2021-02-09 ENCOUNTER — Other Ambulatory Visit (HOSPITAL_COMMUNITY): Payer: Self-pay

## 2021-03-05 ENCOUNTER — Other Ambulatory Visit: Payer: Self-pay | Admitting: Hematology & Oncology

## 2021-03-07 ENCOUNTER — Ambulatory Visit (HOSPITAL_BASED_OUTPATIENT_CLINIC_OR_DEPARTMENT_OTHER)
Admission: RE | Admit: 2021-03-07 | Discharge: 2021-03-07 | Disposition: A | Discharge status: Home / Self Care | Payer: Medicaid Other | Source: Ambulatory Visit | Attending: Hematology & Oncology | Admitting: Hematology & Oncology

## 2021-03-07 ENCOUNTER — Inpatient Hospital Stay (HOSPITAL_BASED_OUTPATIENT_CLINIC_OR_DEPARTMENT_OTHER): Payer: Medicaid Other | Admitting: Hematology & Oncology

## 2021-03-07 ENCOUNTER — Encounter: Payer: Self-pay | Admitting: Hematology & Oncology

## 2021-03-07 ENCOUNTER — Encounter (HOSPITAL_BASED_OUTPATIENT_CLINIC_OR_DEPARTMENT_OTHER): Payer: Self-pay

## 2021-03-07 ENCOUNTER — Other Ambulatory Visit: Payer: Self-pay

## 2021-03-07 ENCOUNTER — Inpatient Hospital Stay: Payer: Medicaid Other | Attending: Hematology & Oncology

## 2021-03-07 VITALS — BP 154/76 | HR 55 | Temp 97.9°F | Resp 16 | Ht 63.0 in | Wt 138.0 lb

## 2021-03-07 DIAGNOSIS — C49A2 Gastrointestinal stromal tumor of stomach: Secondary | ICD-10-CM

## 2021-03-07 DIAGNOSIS — R911 Solitary pulmonary nodule: Secondary | ICD-10-CM | POA: Diagnosis not present

## 2021-03-07 DIAGNOSIS — D259 Leiomyoma of uterus, unspecified: Secondary | ICD-10-CM | POA: Diagnosis not present

## 2021-03-07 DIAGNOSIS — Z79899 Other long term (current) drug therapy: Secondary | ICD-10-CM | POA: Insufficient documentation

## 2021-03-07 LAB — CBC WITH DIFFERENTIAL (CANCER CENTER ONLY)
Abs Immature Granulocytes: 0 10*3/uL (ref 0.00–0.07)
Basophils Absolute: 0 10*3/uL (ref 0.0–0.1)
Basophils Relative: 1 %
Eosinophils Absolute: 0.2 10*3/uL (ref 0.0–0.5)
Eosinophils Relative: 5 %
HCT: 38.6 % (ref 36.0–46.0)
Hemoglobin: 11.8 g/dL — ABNORMAL LOW (ref 12.0–15.0)
Immature Granulocytes: 0 %
Lymphocytes Relative: 35 %
Lymphs Abs: 1.2 10*3/uL (ref 0.7–4.0)
MCH: 24.2 pg — ABNORMAL LOW (ref 26.0–34.0)
MCHC: 30.6 g/dL (ref 30.0–36.0)
MCV: 79.3 fL — ABNORMAL LOW (ref 80.0–100.0)
Monocytes Absolute: 0.5 10*3/uL (ref 0.1–1.0)
Monocytes Relative: 13 %
Neutro Abs: 1.5 10*3/uL — ABNORMAL LOW (ref 1.7–7.7)
Neutrophils Relative %: 46 %
Platelet Count: 199 10*3/uL (ref 150–400)
RBC: 4.87 MIL/uL (ref 3.87–5.11)
RDW: 14.5 % (ref 11.5–15.5)
WBC Count: 3.4 10*3/uL — ABNORMAL LOW (ref 4.0–10.5)
nRBC: 0 % (ref 0.0–0.2)

## 2021-03-07 LAB — CMP (CANCER CENTER ONLY)
ALT: 22 U/L (ref 0–44)
AST: 25 U/L (ref 15–41)
Albumin: 4 g/dL (ref 3.5–5.0)
Alkaline Phosphatase: 90 U/L (ref 38–126)
Anion gap: 8 (ref 5–15)
BUN: 14 mg/dL (ref 8–23)
CO2: 31 mmol/L (ref 22–32)
Calcium: 9.5 mg/dL (ref 8.9–10.3)
Chloride: 101 mmol/L (ref 98–111)
Creatinine: 1.14 mg/dL — ABNORMAL HIGH (ref 0.44–1.00)
GFR, Estimated: 50 mL/min — ABNORMAL LOW (ref >60)
Glucose, Bld: 108 mg/dL — ABNORMAL HIGH (ref 70–99)
Potassium: 3.6 mmol/L (ref 3.5–5.1)
Sodium: 140 mmol/L (ref 135–145)
Total Bilirubin: 0.8 mg/dL (ref 0.3–1.2)
Total Protein: 7.7 g/dL (ref 6.5–8.1)

## 2021-03-07 LAB — LACTATE DEHYDROGENASE: LDH: 210 U/L — ABNORMAL HIGH (ref 98–192)

## 2021-03-07 MED ORDER — IOHEXOL 300 MG/ML  SOLN
100.0000 mL | Freq: Once | INTRAMUSCULAR | Status: AC | PRN
  Administered 2021-03-07: 100 mL via INTRAVENOUS

## 2021-03-07 NOTE — Progress Notes (Signed)
Hematology and Oncology Follow Up Visit  Lori Rush [id number] 1946/06/26 75 y.o. 03/07/2021   Principle Diagnosis:  Stage IIIA  GIST of the stomach  Current Therapy:   Gleevec 400 mg po q day -- start on 07/03/2020     Interim History:  Lori Rush is back for follow-up.  I feel so bad for her.  She had been here for quite a while today.  Apparently there was some confusion about her CT scan..  She did have a CT scan done.  Thankfully, the CT scan did not show any evidence of recurrent disease.  I am so happy about that.  There was a lung nodule that we have been following which is not a larger.  It is 5 mm in size.  She is doing well on the [place].  She has had no problems with diarrhea.  There is no rashes.  She has had no issues with nausea or vomiting.  Yes he might go back to [place] over the holidays.  She is not sure about this as of yet.  She has had no problems with fever.  There is no issues with COVID.  She has had no leg swelling.  She has had no problems with headache.  Overall, her performance status is ECOG 1.    Medications:  Current Outpatient Medications:    acetaminophen (TYLENOL) 325 MG tablet, Take 2 tablets (650 mg total) by mouth every 6 (six) hours as needed for mild pain, fever or headache., Disp: , Rfl:    amLODipine (NORVASC) 10 MG tablet, TAKE 1 TABLET(10 MG) BY MOUTH DAILY, Disp: 30 tablet, Rfl: 4   atorvastatin (LIPITOR) 20 MG tablet, Take 20 mg by mouth at bedtime., Disp: , Rfl:    fluticasone (FLONASE) 50 MCG/ACT nasal spray, Place 1 spray into both nostrils daily as needed for allergies or rhinitis. , Disp: , Rfl:    imatinib (GLEEVEC) 400 MG tablet, TAKE 1 TABLET (400 MG TOTAL) BY MOUTH DAILY. TAKE WITH MEALS AND LARGE GLASS OF WATER. CAUTION: CHEMOTHERAPY., Disp: 30 tablet, Rfl: 6   losartan (COZAAR) 100 MG tablet, Take 100 mg by mouth daily., Disp: , Rfl:    metoprolol (LOPRESSOR) 100 MG tablet, Take 100 mg by mouth 2 (two) times daily., Disp: , Rfl:     ondansetron (ZOFRAN) 4 MG tablet, Take 1 tablet (4 mg total) by mouth every 6 (six) hours as needed for nausea., Disp: 20 tablet, Rfl: 0   pantoprazole (PROTONIX) 40 MG tablet, Take 1 tablet (40 mg total) by mouth 2 (two) times daily for 14 days, THEN 1 tablet (40 mg total) daily. (Patient taking differently: Take one tablet ('40mg'$ ) once daily.), Disp: 58 tablet, Rfl: 0   triamterene-hydrochlorothiazide (MAXZIDE) 75-50 MG tablet, TAKE 1 TABLET BY MOUTH DAILY, Disp: 30 tablet, Rfl: 3   vitamin C (ASCORBIC ACID) 250 MG tablet, Take 1 tablet (250 mg total) by mouth 2 (two) times daily. (Patient taking differently: Take 250 mg by mouth daily.), Disp: , Rfl:    Vitamin D, Ergocalciferol, (DRISDOL) 1.25 MG (50000 UNIT) CAPS capsule, Take 50,000 Units by mouth once a week., Disp: , Rfl:   Allergies: No Known Allergies  Past Medical History, Surgical history, Social history, and Family History were reviewed and updated.  Review of Systems: Review of Systems  Constitutional: Negative.   HENT:  Negative.    Eyes: Negative.   Respiratory: Negative.    Cardiovascular: Negative.   Gastrointestinal: Negative.   Endocrine: Negative.  Genitourinary: Negative.    Musculoskeletal: Negative.   Skin: Negative.   Neurological: Negative.   Hematological: Negative.   Psychiatric/Behavioral: Negative.     Physical Exam:  height is '5\' 3"'$  (1.6 m) and weight is 138 lb (62.6 kg). Her oral temperature is 97.9 F (36.6 C). Her blood pressure is 154/76 (abnormal) and her pulse is 55 (abnormal). Her respiration is 16 and oxygen saturation is 99%.   Wt Readings from Last 3 Encounters:  03/07/21 138 lb (62.6 kg)  10/11/20 135 lb 1.9 oz (61.3 kg)  08/30/20 133 lb 8 oz (60.6 kg)    Physical Exam Vitals reviewed.  HENT:     Head: Normocephalic and atraumatic.  Eyes:     Pupils: Pupils are equal, round, and reactive to light.  Cardiovascular:     Rate and Rhythm: Normal rate and regular rhythm.     Heart  sounds: Normal heart sounds.  Pulmonary:     Effort: Pulmonary effort is normal.     Breath sounds: Normal breath sounds.  Abdominal:     General: Bowel sounds are normal.     Palpations: Abdomen is soft.  Musculoskeletal:        General: No tenderness or deformity. Normal range of motion.     Cervical back: Normal range of motion.  Lymphadenopathy:     Cervical: No cervical adenopathy.  Skin:    General: Skin is warm and dry.     Findings: No erythema or rash.  Neurological:     Mental Status: She is alert and oriented to person, place, and time.  Psychiatric:        Behavior: Behavior normal.        Thought Content: Thought content normal.        Judgment: Judgment normal.   Lab Results  Component Value Date   WBC 3.4 (L) 03/07/2021   HGB 11.8 (L) 03/07/2021   HCT 38.6 03/07/2021   MCV 79.3 (L) 03/07/2021   PLT 199 03/07/2021     Chemistry      Component Value Date/Time   NA 140 03/07/2021 1150   K 3.6 03/07/2021 1150   CL 101 03/07/2021 1150   CO2 31 03/07/2021 1150   BUN 14 03/07/2021 1150   CREATININE 1.14 (H) 03/07/2021 1150      Component Value Date/Time   CALCIUM 9.5 03/07/2021 1150   ALKPHOS 90 03/07/2021 1150   AST 25 03/07/2021 1150   ALT 22 03/07/2021 1150   BILITOT 0.8 03/07/2021 1150      Impression and Plan: Lori Rush is a a very charming 75 year old African female from [place].  She has a resected stomach GIST.  This was done in August 2021.  This was high-grade.  It would be considered stage IIIa.  For right now, I do not see that we like to do another scan on her probably for another year.  I just feel very good about the fact that her GIST is not going to be a problem.  I have no problems with her going over to [place] if she wishes.  I would like to see her back in about 6 months.   [name], MD 8/8/20224:46 PM

## 2021-03-08 ENCOUNTER — Other Ambulatory Visit (HOSPITAL_COMMUNITY): Payer: Self-pay

## 2021-03-08 ENCOUNTER — Encounter: Payer: Self-pay | Admitting: *Deleted

## 2021-03-08 NOTE — Progress Notes (Signed)
Oncology Nurse Navigator Documentation  Oncology Nurse Navigator Flowsheets 03/08/2021  Abnormal Finding Date -  Confirmed Diagnosis Date -  Diagnosis Status -  Navigator Follow Up Date: -  Navigator Follow Up Reason: -  Navigation Complete Date: 03/08/2021  Post Navigation: Continue to Follow Patient? No  Reason Not Navigating Patient: Patient On Maintenance Chemotherapy  Navigator Location [facility]  Referral Date to RadOnc/MedOnc -  Navigator Encounter Type Appt/Treatment Plan Review  Patient Visit Type MedOnc  Treatment Phase Active Tx  Barriers/Navigation Needs No Needs  Education -  Interventions None Required  Acuity Level 1-No Barriers  Coordination of Care -  Education Method -  Support Groups/Services Friends and Family  Time Spent with Patient 15

## 2021-03-10 ENCOUNTER — Other Ambulatory Visit (HOSPITAL_COMMUNITY): Payer: Self-pay

## 2021-04-04 ENCOUNTER — Other Ambulatory Visit: Payer: Self-pay | Admitting: Hematology & Oncology

## 2021-04-06 ENCOUNTER — Other Ambulatory Visit (HOSPITAL_COMMUNITY): Payer: Self-pay

## 2021-04-11 ENCOUNTER — Other Ambulatory Visit (HOSPITAL_COMMUNITY): Payer: Self-pay

## 2021-04-28 ENCOUNTER — Other Ambulatory Visit (HOSPITAL_COMMUNITY): Payer: Self-pay

## 2021-05-02 ENCOUNTER — Other Ambulatory Visit (HOSPITAL_COMMUNITY): Payer: Self-pay

## 2021-05-09 ENCOUNTER — Other Ambulatory Visit (HOSPITAL_COMMUNITY): Payer: Self-pay

## 2021-06-02 ENCOUNTER — Other Ambulatory Visit (HOSPITAL_COMMUNITY): Payer: Self-pay

## 2021-06-06 ENCOUNTER — Other Ambulatory Visit (HOSPITAL_COMMUNITY): Payer: Self-pay

## 2021-06-07 ENCOUNTER — Other Ambulatory Visit (HOSPITAL_COMMUNITY): Payer: Self-pay

## 2021-07-06 ENCOUNTER — Other Ambulatory Visit (HOSPITAL_COMMUNITY): Payer: Self-pay

## 2021-07-07 ENCOUNTER — Other Ambulatory Visit (HOSPITAL_COMMUNITY): Payer: Self-pay

## 2021-07-27 ENCOUNTER — Other Ambulatory Visit: Payer: Self-pay | Admitting: Hematology & Oncology

## 2021-07-29 ENCOUNTER — Other Ambulatory Visit: Payer: Self-pay | Admitting: Hematology & Oncology

## 2021-07-29 ENCOUNTER — Other Ambulatory Visit (HOSPITAL_COMMUNITY): Payer: Self-pay

## 2021-07-30 MED ORDER — IMATINIB MESYLATE 400 MG PO TABS
ORAL_TABLET | ORAL | 6 refills | Status: AC
  Filled 2021-07-30: qty 30, 30d supply, fill #0
  Filled 2021-08-30: qty 30, 30d supply, fill #1
  Filled 2021-09-28: qty 30, 30d supply, fill #2
  Filled 2021-10-26: qty 30, 30d supply, fill #3
  Filled 2021-11-24: qty 30, 30d supply, fill #4
  Filled 2021-12-21: qty 30, 30d supply, fill #5

## 2021-08-01 ENCOUNTER — Other Ambulatory Visit (HOSPITAL_COMMUNITY): Payer: Self-pay

## 2021-08-04 ENCOUNTER — Other Ambulatory Visit (HOSPITAL_COMMUNITY): Payer: Self-pay

## 2021-08-09 ENCOUNTER — Other Ambulatory Visit (HOSPITAL_COMMUNITY): Payer: Self-pay

## 2021-08-18 ENCOUNTER — Ambulatory Visit: Payer: Medicaid Other | Admitting: Hematology & Oncology

## 2021-08-18 ENCOUNTER — Other Ambulatory Visit: Payer: Medicaid Other

## 2021-08-22 ENCOUNTER — Other Ambulatory Visit: Payer: Medicaid Other

## 2021-08-22 ENCOUNTER — Ambulatory Visit: Payer: Medicaid Other | Admitting: Hematology & Oncology

## 2021-08-26 ENCOUNTER — Other Ambulatory Visit (HOSPITAL_COMMUNITY): Payer: Self-pay

## 2021-08-29 ENCOUNTER — Telehealth: Payer: Self-pay | Admitting: *Deleted

## 2021-08-29 ENCOUNTER — Ambulatory Visit (HOSPITAL_BASED_OUTPATIENT_CLINIC_OR_DEPARTMENT_OTHER)
Admission: RE | Admit: 2021-08-29 | Discharge: 2021-08-29 | Disposition: A | Discharge status: Home / Self Care | Payer: Medicaid Other | Source: Ambulatory Visit | Attending: Hematology & Oncology | Admitting: Hematology & Oncology

## 2021-08-29 ENCOUNTER — Encounter (HOSPITAL_BASED_OUTPATIENT_CLINIC_OR_DEPARTMENT_OTHER): Payer: Self-pay

## 2021-08-29 ENCOUNTER — Encounter: Payer: Self-pay | Admitting: Hematology & Oncology

## 2021-08-29 ENCOUNTER — Other Ambulatory Visit: Payer: Self-pay

## 2021-08-29 ENCOUNTER — Telehealth: Payer: Self-pay

## 2021-08-29 ENCOUNTER — Inpatient Hospital Stay (HOSPITAL_BASED_OUTPATIENT_CLINIC_OR_DEPARTMENT_OTHER): Payer: Medicaid Other | Admitting: Hematology & Oncology

## 2021-08-29 ENCOUNTER — Inpatient Hospital Stay: Payer: Medicaid Other | Attending: Hematology & Oncology

## 2021-08-29 VITALS — BP 148/74 | HR 56 | Temp 97.8°F | Resp 16 | Wt 135.0 lb

## 2021-08-29 DIAGNOSIS — C49A2 Gastrointestinal stromal tumor of stomach: Secondary | ICD-10-CM

## 2021-08-29 DIAGNOSIS — Z79899 Other long term (current) drug therapy: Secondary | ICD-10-CM | POA: Diagnosis not present

## 2021-08-29 LAB — CBC WITH DIFFERENTIAL (CANCER CENTER ONLY)
Abs Immature Granulocytes: 0.02 10*3/uL (ref 0.00–0.07)
Basophils Absolute: 0.1 10*3/uL (ref 0.0–0.1)
Basophils Relative: 1 %
Eosinophils Absolute: 0.2 10*3/uL (ref 0.0–0.5)
Eosinophils Relative: 4 %
HCT: 37.2 % (ref 36.0–46.0)
Hemoglobin: 11.5 g/dL — ABNORMAL LOW (ref 12.0–15.0)
Immature Granulocytes: 0 %
Lymphocytes Relative: 30 %
Lymphs Abs: 1.4 10*3/uL (ref 0.7–4.0)
MCH: 24.4 pg — ABNORMAL LOW (ref 26.0–34.0)
MCHC: 30.9 g/dL (ref 30.0–36.0)
MCV: 78.8 fL — ABNORMAL LOW (ref 80.0–100.0)
Monocytes Absolute: 0.6 10*3/uL (ref 0.1–1.0)
Monocytes Relative: 13 %
Neutro Abs: 2.3 10*3/uL (ref 1.7–7.7)
Neutrophils Relative %: 52 %
Platelet Count: 213 10*3/uL (ref 150–400)
RBC: 4.72 MIL/uL (ref 3.87–5.11)
RDW: 14.2 % (ref 11.5–15.5)
WBC Count: 4.5 10*3/uL (ref 4.0–10.5)
nRBC: 0 % (ref 0.0–0.2)

## 2021-08-29 LAB — CMP (CANCER CENTER ONLY)
ALT: 17 U/L (ref 0–44)
AST: 22 U/L (ref 15–41)
Albumin: 4 g/dL (ref 3.5–5.0)
Alkaline Phosphatase: 80 U/L (ref 38–126)
Anion gap: 7 (ref 5–15)
BUN: 24 mg/dL — ABNORMAL HIGH (ref 8–23)
CO2: 29 mmol/L (ref 22–32)
Calcium: 9.6 mg/dL (ref 8.9–10.3)
Chloride: 104 mmol/L (ref 98–111)
Creatinine: 1.19 mg/dL — ABNORMAL HIGH (ref 0.44–1.00)
GFR, Estimated: 47 mL/min — ABNORMAL LOW (ref >60)
Glucose, Bld: 105 mg/dL — ABNORMAL HIGH (ref 70–99)
Potassium: 3.7 mmol/L (ref 3.5–5.1)
Sodium: 140 mmol/L (ref 135–145)
Total Bilirubin: 0.5 mg/dL (ref 0.3–1.2)
Total Protein: 7.5 g/dL (ref 6.5–8.1)

## 2021-08-29 LAB — LACTATE DEHYDROGENASE: LDH: 223 U/L — ABNORMAL HIGH (ref 98–192)

## 2021-08-29 MED ORDER — IOHEXOL 300 MG/ML  SOLN
100.0000 mL | Freq: Once | INTRAMUSCULAR | Status: AC | PRN
  Administered 2021-08-29: 100 mL via INTRAVENOUS

## 2021-08-29 NOTE — Telephone Encounter (Signed)
New patient referral made via fax to [facility] in [place] [phone] per Dr Marin Olp. dph

## 2021-08-29 NOTE — Telephone Encounter (Signed)
As noted below by Dr. Marin Olp, I informed the patient that there is no evidence of cancer on the CT scan. She verbalized understanding.

## 2021-08-29 NOTE — Progress Notes (Signed)
Hematology and Oncology Follow Up Visit  Lori Rush [id number] [birth year] 76 y.o. 08/29/2021   Principle Diagnosis:  Stage IIIA  GIST of the stomach  Current Therapy:   Gleevec 400 mg po q day -- start on 07/03/2020     Interim History:  Lori Rush is back for follow-up.  The real big news that she is moved.  She actually moved up to [place].  I am surprised that she came back to see [place].  I told her that we can find her a doctor up in [place] to make things a lot easier for her.  She came down on the bus.  She is going back in a couple weeks.  I know she has family that her that she will visit.  While she is down here, we will see about doing a scan on her.  Her last scan was back in August.  She has had no problems with the [place].  There is been no swelling.  She has had no rashes.  She has had no nausea or vomiting.  There is no cough or shortness of breath.  She has had no change in bowel or bladder habits.  She has had no diarrhea.  There is been no bleeding.  She has had no problems with COVID.  Overall, I would say performance status is ECOG 1.     Medications:  Current Outpatient Medications:    acetaminophen (TYLENOL) 325 MG tablet, Take 2 tablets (650 mg total) by mouth every 6 (six) hours as needed for mild pain, fever or headache., Disp: , Rfl:    amLODipine (NORVASC) 10 MG tablet, TAKE 1 TABLET(10 MG) BY MOUTH DAILY, Disp: 30 tablet, Rfl: 4   atorvastatin (LIPITOR) 20 MG tablet, Take 20 mg by mouth at bedtime., Disp: , Rfl:    fluticasone (FLONASE) 50 MCG/ACT nasal spray, Place 1 spray into both nostrils daily as needed for allergies or rhinitis. , Disp: , Rfl:    imatinib (GLEEVEC) 400 MG tablet, TAKE 1 TABLET (400 MG TOTAL) BY MOUTH DAILY. TAKE WITH MEALS AND LARGE GLASS OF WATER. CAUTION: CHEMOTHERAPY., Disp: 30 tablet, Rfl: 6   losartan (COZAAR) 100 MG tablet, Take 100 mg by mouth daily., Disp: , Rfl:    metoprolol (LOPRESSOR) 100 MG tablet, Take 100 mg  by mouth 2 (two) times daily., Disp: , Rfl:    ondansetron (ZOFRAN) 4 MG tablet, Take 1 tablet (4 mg total) by mouth every 6 (six) hours as needed for nausea., Disp: 20 tablet, Rfl: 0   pantoprazole (PROTONIX) 40 MG tablet, Take 1 tablet (40 mg total) by mouth 2 (two) times daily for 14 days, THEN 1 tablet (40 mg total) daily. (Patient taking differently: Take one tablet (40mg ) once daily.), Disp: 58 tablet, Rfl: 0   triamterene-hydrochlorothiazide (MAXZIDE) 75-50 MG tablet, TAKE 1 TABLET BY MOUTH DAILY, Disp: 90 tablet, Rfl: 1   vitamin C (ASCORBIC ACID) 250 MG tablet, Take 1 tablet (250 mg total) by mouth 2 (two) times daily. (Patient taking differently: Take 250 mg by mouth daily.), Disp: , Rfl:    Vitamin D, Ergocalciferol, (DRISDOL) 1.25 MG (50000 UNIT) CAPS capsule, Take 50,000 Units by mouth once a week., Disp: , Rfl:   Allergies: No Known Allergies  Past Medical History, Surgical history, Social history, and Family History were reviewed and updated.  Review of Systems: Review of Systems  Constitutional: Negative.   HENT:  Negative.    Eyes: Negative.   Respiratory: Negative.  Cardiovascular: Negative.   Gastrointestinal: Negative.   Endocrine: Negative.   Genitourinary: Negative.    Musculoskeletal: Negative.   Skin: Negative.   Neurological: Negative.   Hematological: Negative.   Psychiatric/Behavioral: Negative.     Physical Exam:  weight is 135 lb (61.2 kg). Her oral temperature is 97.8 F (36.6 C). Her blood pressure is 148/74 (abnormal) and her pulse is 56 (abnormal). Her respiration is 16 and oxygen saturation is 100%.   Wt Readings from Last 3 Encounters:  08/29/21 135 lb (61.2 kg)  03/07/21 138 lb (62.6 kg)  10/11/20 135 lb 1.9 oz (61.3 kg)    Physical Exam Vitals reviewed.  HENT:     Head: Normocephalic and atraumatic.  Eyes:     Pupils: Pupils are equal, round, and reactive to light.  Cardiovascular:     Rate and Rhythm: Normal rate and regular rhythm.      Heart sounds: Normal heart sounds.  Pulmonary:     Effort: Pulmonary effort is normal.     Breath sounds: Normal breath sounds.  Abdominal:     General: Bowel sounds are normal.     Palpations: Abdomen is soft.  Musculoskeletal:        General: No tenderness or deformity. Normal range of motion.     Cervical back: Normal range of motion.  Lymphadenopathy:     Cervical: No cervical adenopathy.  Skin:    General: Skin is warm and dry.     Findings: No erythema or rash.  Neurological:     Mental Status: She is alert and oriented to person, place, and time.  Psychiatric:        Behavior: Behavior normal.        Thought Content: Thought content normal.        Judgment: Judgment normal.   Lab Results  Component Value Date   WBC 4.5 08/29/2021   HGB 11.5 (L) 08/29/2021   HCT 37.2 08/29/2021   MCV 78.8 (L) 08/29/2021   PLT 213 08/29/2021     Chemistry      Component Value Date/Time   NA 140 03/07/2021 1150   K 3.6 03/07/2021 1150   CL 101 03/07/2021 1150   CO2 31 03/07/2021 1150   BUN 14 03/07/2021 1150   CREATININE 1.14 (H) 03/07/2021 1150      Component Value Date/Time   CALCIUM 9.5 03/07/2021 1150   ALKPHOS 90 03/07/2021 1150   AST 25 03/07/2021 1150   ALT 22 03/07/2021 1150   BILITOT 0.8 03/07/2021 1150      Impression and Plan: Lori Rush is a a very charming 76 year old African female from [place].  She has a resected stomach GIST.  This was done in August 2021.  This was high-grade.  It would be considered stage IIIa.  We will set her up with a CT scan.  We will get one while she is down here.  Hopefully, we can get this today.  We will find her a oncologist up in [place], [place].  Again this to be somewhat easier for her.  I loved seeing her.  We will miss her.  I does want to make life easier for her.  Hopefully we will be able to do our scan today.  [name], MD 1/30/20238:05 AM

## 2021-08-29 NOTE — Telephone Encounter (Signed)
-----   Message from [name], MD sent at 08/29/2021  1:41 PM EST ----- Call and tell her that there is no evidence of cancer on the CT scan.  Lori Rush

## 2021-08-30 ENCOUNTER — Other Ambulatory Visit (HOSPITAL_COMMUNITY): Payer: Self-pay

## 2021-09-01 ENCOUNTER — Other Ambulatory Visit (HOSPITAL_COMMUNITY): Payer: Self-pay

## 2021-09-26 ENCOUNTER — Other Ambulatory Visit (HOSPITAL_COMMUNITY): Payer: Self-pay

## 2021-09-28 ENCOUNTER — Other Ambulatory Visit (HOSPITAL_COMMUNITY): Payer: Self-pay

## 2021-09-29 ENCOUNTER — Other Ambulatory Visit (HOSPITAL_COMMUNITY): Payer: Self-pay

## 2021-10-24 ENCOUNTER — Other Ambulatory Visit (HOSPITAL_COMMUNITY): Payer: Self-pay

## 2021-10-26 ENCOUNTER — Other Ambulatory Visit (HOSPITAL_COMMUNITY): Payer: Self-pay

## 2021-10-27 ENCOUNTER — Other Ambulatory Visit (HOSPITAL_COMMUNITY): Payer: Self-pay

## 2021-10-28 ENCOUNTER — Other Ambulatory Visit (HOSPITAL_COMMUNITY): Payer: Self-pay

## 2021-11-07 IMAGING — CT CT ABD-PELV W/ CM
2 of 5 series · 11 of 36 positions shown, 13 images · IV contrast (APPLIED)
Comparison: None

CLINICAL DATA: Submucosal mass on EGD

EXAM:
CT CHEST, ABDOMEN, AND PELVIS WITH CONTRAST
TECHNIQUE: Multidetector CT imaging of the chest, abdomen and pelvis was
performed following the standard protocol during bolus
administration of intravenous contrast.
CONTRAST:  100mL OMNIPAQUE IOHEXOL 300 MG/ML  SOLN

[Series 2: cap with · axial · 0.77mm/px · z∈[-423,+32]mm · 8 of 119 slices shown, 10 images]
[im 14/119  mediastinal]
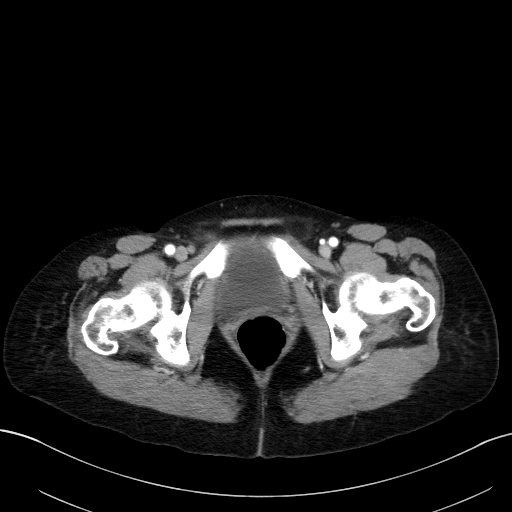
[im 14/119  lung]
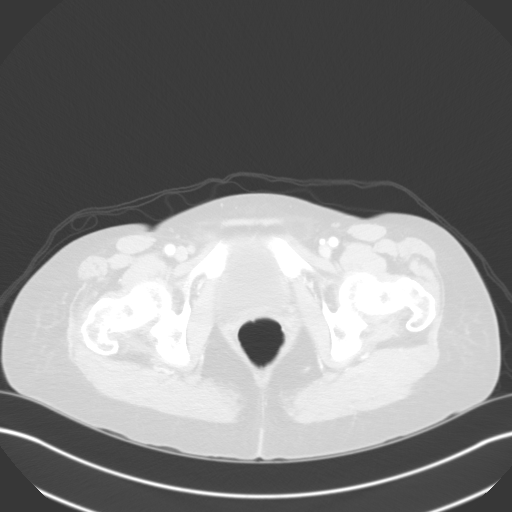
[im 27/119  lung]
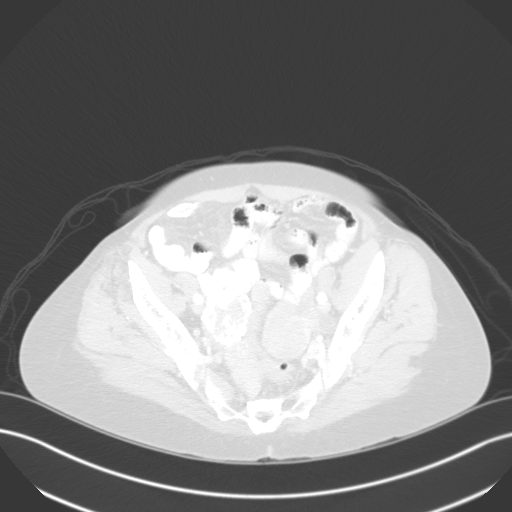
[im 40/119  lung]
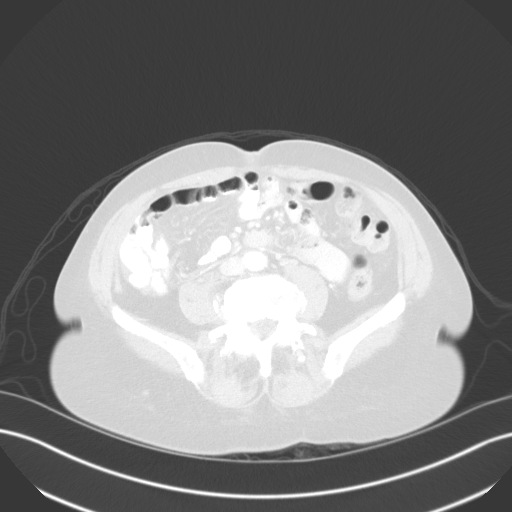
[im 53/119  lung]
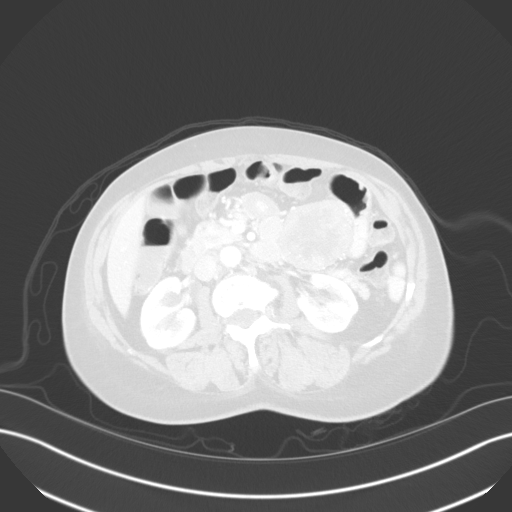
[im 66/119  mediastinal]
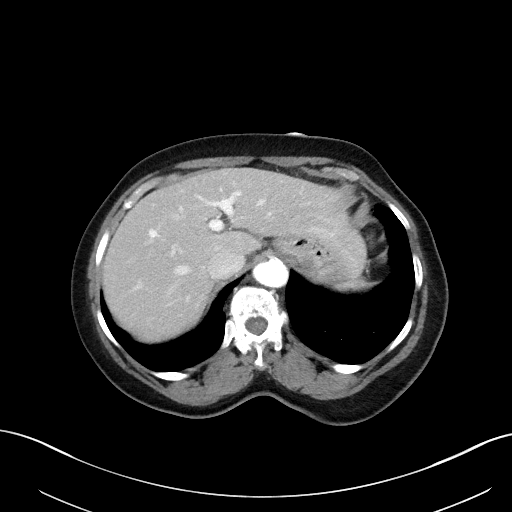
[im 66/119  lung]
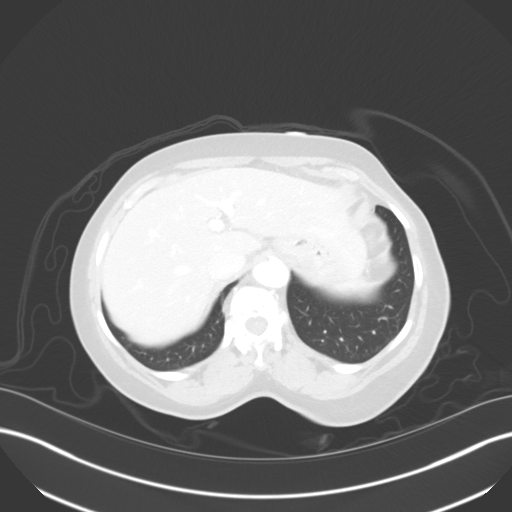
[im 79/119  lung]
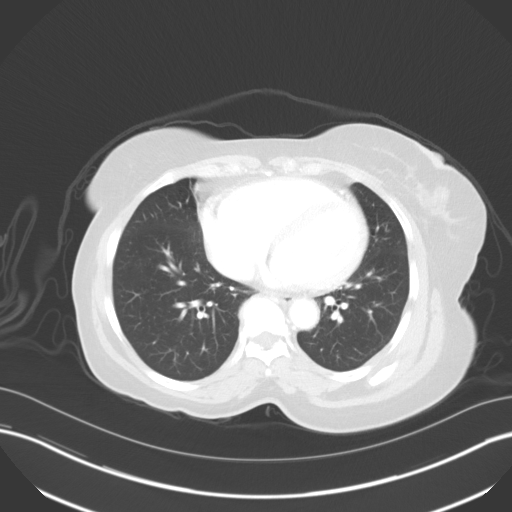
[im 92/119  lung]
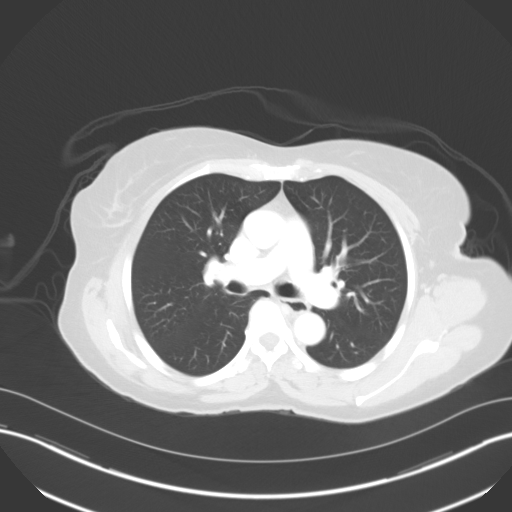
[im 105/119  lung]
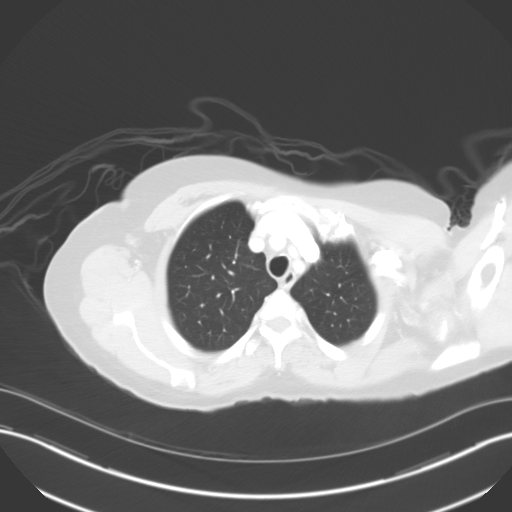

[Series 5: coronals · coronal · 0.60mm/px · 3 of 128 slices shown]
[im 26/128  lung]
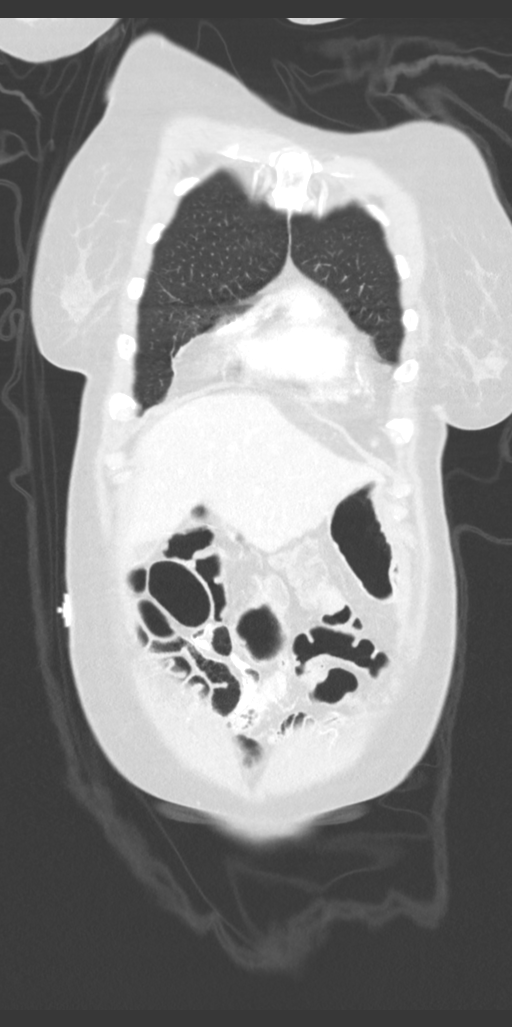
[im 51/128  lung]
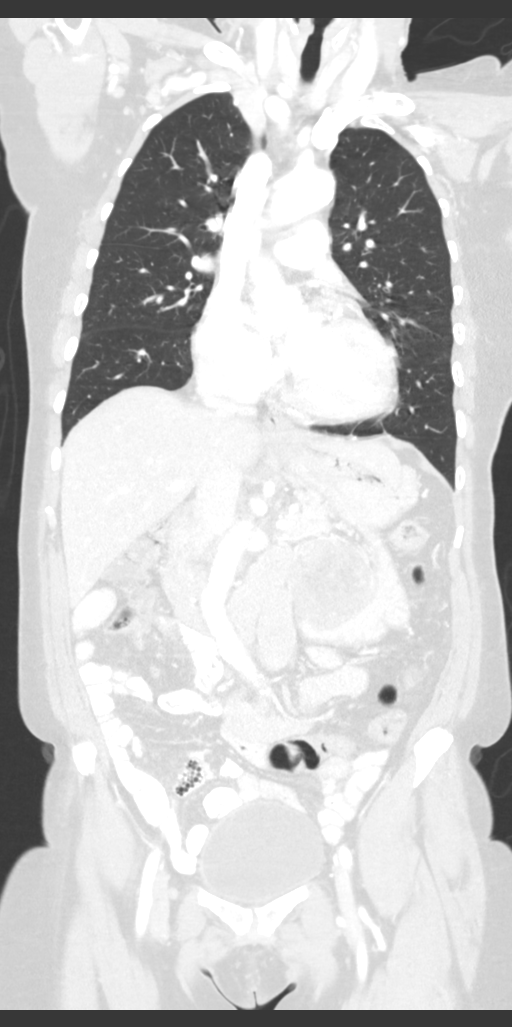
[im 77/128  lung]
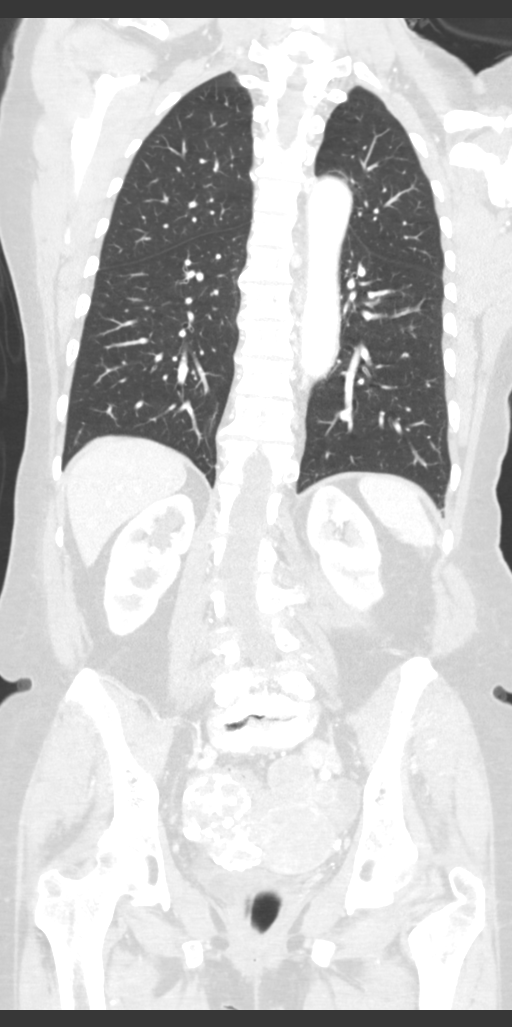

[11 of 36 positions shown; findings below may reference images not displayed]

FINDINGS: CT CHEST FINDINGS

Cardiovascular: Calcified and noncalcified plaque throughout the
thoracic aorta. No aneurysmal dilation. Central pulmonary
vasculature on venous phase assessment is unremarkable. Heart size
mildly enlarged with no pericardial effusion.

Mediastinum/Nodes: Thoracic inlet structures are unremarkable.

No axillary lymphadenopathy. No mediastinal adenopathy. No hilar
adenopathy.

Lungs/Pleura: Airways are patent. Mild atelectasis. Small pulmonary
nodule in the LEFT lung base 7 x 6 mm (image 120, series 4)

Musculoskeletal: No acute or destructive bone process relative to
the bony thorax. See below for full musculoskeletal details.

CT ABDOMEN PELVIS FINDINGS

Hepatobiliary: No focal, suspicious hepatic lesion. The portal vein
is patent. Mild biliary duct dilation to approximately 5-6 mm is
nonspecific. No pericholecystic stranding or fluid. No gallbladder
distension.

Pancreas: Pancreas is normal without ductal dilation or
inflammation.

Spleen: Normal in size and contour.

Adrenals/Urinary Tract: Adrenal glands are normal.

Renal enhancement is symmetric. Duplicated collecting system on the
RIGHT with dilated calices more in the upper than in the lower pole
but also in the lower pole. Two ureters are present to at least the
level of the distal third of the RIGHT ureter.

Stomach/Bowel: Lesion along the lesser curvature of the stomach with
rounded, well-circumscribed margins and heterogeneous enhancement
measuring 5.8 x 4.8 x 5.3 cm reportedly submucosal on endoscopic
evaluation extending into the hepatic gastric ligament. Ulceration
and or site of biopsy along the inferior margin. Small-bowel without
acute process.

Appendix is normal.

Vascular/Lymphatic: Calcified atheromatous plaque in the abdominal
aorta. No adenopathy in the retroperitoneum. No upper abdominal
lymphadenopathy.

Reproductive: Bulky uterus with multiple enhancing masses and with a
calcified fibroid along the RIGHT uterus.

One of these exophytic heterogeneous masses is dumbbell-shaped and
extends towards the LEFT adnexa. Area is not well characterized on
CT some of these measuring as large as 4.3 x 4.5 cm.

Other: No ascites.

There is however a small ventral hernia containing fluid density
just below the xiphoid, of uncertain significance given the absence
of ascites or the presence of morphologic changes of liver disease.

Musculoskeletal: Spinal degenerative changes. Dextroconvex curvature
of the spine. L5-S1 anterolisthesis, grade 2 associated with
degenerative changes.
IMPRESSION: 1. Gastric mass with features most suggestive of GIST tumor.
2. Bulky appearance of the uterus with heterogeneous masslike areas
more likely fibroids particularly given the calcified appearance of
the RIGHT uterus. One of these lesions displays a more lobular
morphology and extends towards the LEFT adnexa. Leiomyomata are
considered. In this 74-year-old female would correlate with any
changes in pelvic symptoms, bleeding or enlargement of the uterus
noted on physical examination. Follow-up pelvic MRI may be
beneficial given the atypical features of what may be a large
subserosal leiomyoma along the LEFT fundus to exclude presence of
malignant transformation.
3. Small ventral hernia containing fluid density just below the
xiphoid, of uncertain significance given the absence of ascites or
the presence of morphologic changes of liver disease. Focused
ultrasound of this area may be helpful. No soft tissue nodularity is
visible on CT.
4. Small pulmonary nodule in the LEFT lung base 7 x 6 mm. Close
attention on follow-up is suggested. This remains nonspecific but
suspicious given other findings.
5. collecting systems of the RIGHT kidney are dilated with 2 ureters
to at least the level of the sacral promontory. Findings may reflect
chronic reflux and mild obstruction. Correlate with any symptoms and
consider follow-up renal sonogram to assess for any changes over
time.
6. Aortic atherosclerosis.

These results will be called to the ordering clinician or
representative by the Radiologist Assistant, and communication
documented in the PACS or [REDACTED].

Aortic Atherosclerosis ([id number]).

## 2021-11-21 ENCOUNTER — Other Ambulatory Visit (HOSPITAL_COMMUNITY): Payer: Self-pay

## 2021-11-23 ENCOUNTER — Other Ambulatory Visit (HOSPITAL_COMMUNITY): Payer: Self-pay

## 2021-11-24 ENCOUNTER — Other Ambulatory Visit (HOSPITAL_COMMUNITY): Payer: Self-pay

## 2021-11-25 ENCOUNTER — Other Ambulatory Visit (HOSPITAL_COMMUNITY): Payer: Self-pay

## 2021-12-21 ENCOUNTER — Other Ambulatory Visit (HOSPITAL_COMMUNITY): Payer: Self-pay

## 2021-12-22 ENCOUNTER — Other Ambulatory Visit (HOSPITAL_COMMUNITY): Payer: Self-pay

## 2022-01-16 ENCOUNTER — Other Ambulatory Visit (HOSPITAL_COMMUNITY): Payer: Self-pay

## 2022-01-18 ENCOUNTER — Other Ambulatory Visit (HOSPITAL_COMMUNITY): Payer: Self-pay

## 2022-01-23 ENCOUNTER — Other Ambulatory Visit (HOSPITAL_COMMUNITY): Payer: Self-pay

## 2022-03-11 ENCOUNTER — Other Ambulatory Visit (HOSPITAL_COMMUNITY): Payer: Self-pay

## 2022-08-04 ENCOUNTER — Other Ambulatory Visit: Payer: Self-pay | Admitting: Hematology & Oncology

## 2022-11-16 ENCOUNTER — Other Ambulatory Visit (HOSPITAL_COMMUNITY): Payer: Self-pay

## 2023-02-02 ENCOUNTER — Other Ambulatory Visit: Payer: Self-pay | Admitting: Hematology & Oncology

## 2023-09-07 ENCOUNTER — Other Ambulatory Visit: Payer: Self-pay

## 2023-09-07 DIAGNOSIS — C49A2 Gastrointestinal stromal tumor of stomach: Secondary | ICD-10-CM

## 2023-09-10 ENCOUNTER — Inpatient Hospital Stay: Payer: Medicare Other | Attending: Hematology & Oncology

## 2023-09-10 ENCOUNTER — Encounter: Payer: Self-pay | Admitting: Hematology & Oncology

## 2023-09-10 ENCOUNTER — Inpatient Hospital Stay (HOSPITAL_BASED_OUTPATIENT_CLINIC_OR_DEPARTMENT_OTHER): Payer: Medicare Other | Admitting: Hematology & Oncology

## 2023-09-10 VITALS — BP 161/70 | HR 48 | Temp 99.0°F | Resp 20 | Ht 62.0 in | Wt 153.0 lb

## 2023-09-10 DIAGNOSIS — C49A2 Gastrointestinal stromal tumor of stomach: Secondary | ICD-10-CM | POA: Diagnosis present

## 2023-09-10 DIAGNOSIS — Z79899 Other long term (current) drug therapy: Secondary | ICD-10-CM | POA: Diagnosis not present

## 2023-09-10 LAB — CMP (CANCER CENTER ONLY)
ALT: 18 U/L (ref 0–44)
AST: 21 U/L (ref 15–41)
Albumin: 4.2 g/dL (ref 3.5–5.0)
Alkaline Phosphatase: 84 U/L (ref 38–126)
Anion gap: 10 (ref 5–15)
BUN: 15 mg/dL (ref 8–23)
CO2: 28 mmol/L (ref 22–32)
Calcium: 9.5 mg/dL (ref 8.9–10.3)
Chloride: 102 mmol/L (ref 98–111)
Creatinine: 0.97 mg/dL (ref 0.44–1.00)
GFR, Estimated: 59 mL/min — ABNORMAL LOW (ref >60)
Glucose, Bld: 100 mg/dL — ABNORMAL HIGH (ref 70–99)
Potassium: 3.4 mmol/L — ABNORMAL LOW (ref 3.5–5.1)
Sodium: 140 mmol/L (ref 135–145)
Total Bilirubin: 0.7 mg/dL (ref 0.0–1.2)
Total Protein: 8.2 g/dL — ABNORMAL HIGH (ref 6.5–8.1)

## 2023-09-10 LAB — CBC WITH DIFFERENTIAL (CANCER CENTER ONLY)
Abs Immature Granulocytes: 0.01 10*3/uL (ref 0.00–0.07)
Basophils Absolute: 0.1 10*3/uL (ref 0.0–0.1)
Basophils Relative: 1 %
Eosinophils Absolute: 0.2 10*3/uL (ref 0.0–0.5)
Eosinophils Relative: 4 %
HCT: 40.5 % (ref 36.0–46.0)
Hemoglobin: 12.5 g/dL (ref 12.0–15.0)
Immature Granulocytes: 0 %
Lymphocytes Relative: 32 %
Lymphs Abs: 1.6 10*3/uL (ref 0.7–4.0)
MCH: 24.5 pg — ABNORMAL LOW (ref 26.0–34.0)
MCHC: 30.9 g/dL (ref 30.0–36.0)
MCV: 79.3 fL — ABNORMAL LOW (ref 80.0–100.0)
Monocytes Absolute: 0.4 10*3/uL (ref 0.1–1.0)
Monocytes Relative: 9 %
Neutro Abs: 2.7 10*3/uL (ref 1.7–7.7)
Neutrophils Relative %: 54 %
Platelet Count: 174 10*3/uL (ref 150–400)
RBC: 5.11 MIL/uL (ref 3.87–5.11)
RDW: 13.7 % (ref 11.5–15.5)
WBC Count: 5 10*3/uL (ref 4.0–10.5)
nRBC: 0 % (ref 0.0–0.2)

## 2023-09-10 LAB — LACTATE DEHYDROGENASE: LDH: 211 U/L — ABNORMAL HIGH (ref 98–192)

## 2023-09-10 NOTE — Progress Notes (Signed)
 BP remains elevated, 145/71, instructed to monitor at home and notify PCP if if remains over 140/90. Verbalized understanding.

## 2023-09-10 NOTE — Progress Notes (Signed)
 Hematology and Oncology Follow Up Visit  Lori Rush [id number] [birth year] 78 y.o. 09/10/2023   Principle Diagnosis:  Stage IIIA GIST of the stomach  Current Therapy:   Gleevec  400 mg p.o. daily  --5 years -- completed in 12/ 2026     Interim History:  Lori Rush is back for a long awaited visit.  We last saw her back in January 2023.  At that time, she is supposed to move up to [place] .  Of still not sure she moved up there.  Regardless, she is back with us  now.  She is not working.  I am unsure if she is retired.  I will have to suspect that she is retired.  Since we last saw her, she has had no problems.  She has had no abdominal pain.  There is been no change in bowel or bladder habits.  She has had no cough or shortness of breath.  There is been no issues with fever.  She has had no issues with COVID.  There is been no bleeding.  She has had no leg swelling.  She has had no fluid retention.  Currently, I would say that her performance status is probably ECOG 1.  Medications:  Current Outpatient Medications:    acetaminophen  (TYLENOL ) 325 MG tablet, Take 2 tablets (650 mg total) by mouth every 6 (six) hours as needed for mild pain, fever or headache., Disp: , Rfl:    amLODipine  (NORVASC ) 10 MG tablet, TAKE 1 TABLET(10 MG) BY MOUTH DAILY, Disp: 30 tablet, Rfl: 4   atorvastatin  (LIPITOR) 20 MG tablet, Take 20 mg by mouth at bedtime., Disp: , Rfl:    Azelastine HCl 137 MCG/SPRAY SOLN, Place into the nose 2 (two) times daily as needed., Disp: , Rfl:    dorzolamide-timolol (COSOPT) 2-0.5 % ophthalmic solution, Place 2 drops into both eyes 2 (two) times daily., Disp: , Rfl:    fluticasone  (FLONASE ) 50 MCG/ACT nasal spray, Place 1 spray into both nostrils daily as needed for allergies or rhinitis. , Disp: , Rfl:    losartan  (COZAAR ) 100 MG tablet, Take 100 mg by mouth daily., Disp: , Rfl:    metoprolol  (LOPRESSOR ) 100 MG tablet, Take 100 mg by mouth 2 (two) times daily., Disp: ,  Rfl:    ondansetron  (ZOFRAN ) 4 MG tablet, Take 1 tablet (4 mg total) by mouth every 6 (six) hours as needed for nausea., Disp: 20 tablet, Rfl: 0   triamterene -hydrochlorothiazide (MAXZIDE) 75-50 MG tablet, TAKE 1 TABLET BY MOUTH DAILY, Disp: 90 tablet, Rfl: 1   vitamin C  (ASCORBIC ACID ) 250 MG tablet, Take 1 tablet (250 mg total) by mouth 2 (two) times daily. (Patient taking differently: Take 250 mg by mouth daily.), Disp: , Rfl:    Vitamin D, Ergocalciferol, (DRISDOL) 1.25 MG (50000 UNIT) CAPS capsule, Take 50,000 Units by mouth once a week., Disp: , Rfl:    pantoprazole  (PROTONIX ) 40 MG tablet, Take 1 tablet (40 mg total) by mouth 2 (two) times daily for 14 days, THEN 1 tablet (40 mg total) daily. (Patient taking differently: Take one tablet (40mg ) once daily.), Disp: 58 tablet, Rfl: 0  Allergies: No Known Allergies  Past Medical History, Surgical history, Social history, and Family History were reviewed and updated.  Review of Systems: Review of Systems  Constitutional: Negative.   HENT:  Negative.    Eyes: Negative.   Respiratory: Negative.    Cardiovascular: Negative.   Gastrointestinal: Negative.   Endocrine: Negative.   Genitourinary: Negative.  Musculoskeletal: Negative.   Skin: Negative.   Neurological: Negative.   Hematological: Negative.   Psychiatric/Behavioral: Negative.      Physical Exam:  height is 5\' 2"  (1.575 m) and weight is 153 lb (69.4 kg). Her oral temperature is 99 F (37.2 C). Her blood pressure is 161/70 (abnormal) and her pulse is 48 (abnormal). Her respiration is 20 and oxygen saturation is 98%.   Wt Readings from Last 3 Encounters:  09/10/23 153 lb (69.4 kg)  08/29/21 135 lb (61.2 kg)  03/07/21 138 lb (62.6 kg)    Physical Exam Vitals reviewed.  HENT:     Head: Normocephalic and atraumatic.  Eyes:     Pupils: Pupils are equal, round, and reactive to light.  Cardiovascular:     Rate and Rhythm: Normal rate and regular rhythm.     Heart  sounds: Normal heart sounds.  Pulmonary:     Effort: Pulmonary effort is normal.     Breath sounds: Normal breath sounds.  Abdominal:     General: Bowel sounds are normal.     Palpations: Abdomen is soft.  Musculoskeletal:        General: No tenderness or deformity. Normal range of motion.     Cervical back: Normal range of motion.  Lymphadenopathy:     Cervical: No cervical adenopathy.  Skin:    General: Skin is warm and dry.     Findings: No erythema or rash.  Neurological:     Mental Status: She is alert and oriented to person, place, and time.  Psychiatric:        Behavior: Behavior normal.        Thought Content: Thought content normal.        Judgment: Judgment normal.     Lab Results  Component Value Date   WBC 5.0 09/10/2023   HGB 12.5 09/10/2023   HCT 40.5 09/10/2023   MCV 79.3 (L) 09/10/2023   PLT 174 09/10/2023     Chemistry      Component Value Date/Time   NA 140 09/10/2023 1235   K 3.4 (L) 09/10/2023 1235   CL 102 09/10/2023 1235   CO2 28 09/10/2023 1235   BUN 15 09/10/2023 1235   CREATININE 0.97 09/10/2023 1235      Component Value Date/Time   CALCIUM  9.5 09/10/2023 1235   ALKPHOS 84 09/10/2023 1235   AST 21 09/10/2023 1235   ALT 18 09/10/2023 1235   BILITOT 0.7 09/10/2023 1235       Impression and Plan: Lori Rush is a very charming 78 year old African-American woman.  She has a locally advanced GIST.  This was resected.  She is doing well without any evidence of recurrence.  I am glad that we can still see her again.  She is very very nice.  Is always a much fun talking with her.  We will plan to get her back in 6 months.  Again, I think we can hold off on any scans for right now.  She will finish up the Gleevec  at the end of 2026.   Ivor Mars, MD 2/10/20251:15 PM

## 2024-03-10 ENCOUNTER — Encounter: Payer: Self-pay | Admitting: Hematology & Oncology

## 2024-03-10 ENCOUNTER — Inpatient Hospital Stay (HOSPITAL_BASED_OUTPATIENT_CLINIC_OR_DEPARTMENT_OTHER): Payer: Medicare Other | Admitting: Hematology & Oncology

## 2024-03-10 ENCOUNTER — Inpatient Hospital Stay: Payer: Medicare Other | Attending: Hematology & Oncology

## 2024-03-10 VITALS — BP 169/83 | HR 56 | Temp 99.0°F | Wt 150.0 lb

## 2024-03-10 DIAGNOSIS — Z79899 Other long term (current) drug therapy: Secondary | ICD-10-CM | POA: Insufficient documentation

## 2024-03-10 DIAGNOSIS — Z1231 Encounter for screening mammogram for malignant neoplasm of breast: Secondary | ICD-10-CM | POA: Diagnosis not present

## 2024-03-10 DIAGNOSIS — C49A2 Gastrointestinal stromal tumor of stomach: Secondary | ICD-10-CM

## 2024-03-10 LAB — CMP (CANCER CENTER ONLY)
ALT: 18 U/L (ref 0–44)
AST: 26 U/L (ref 15–41)
Albumin: 4.2 g/dL (ref 3.5–5.0)
Alkaline Phosphatase: 87 U/L (ref 38–126)
Anion gap: 11 (ref 5–15)
BUN: 13 mg/dL (ref 8–23)
CO2: 27 mmol/L (ref 22–32)
Calcium: 9.5 mg/dL (ref 8.9–10.3)
Chloride: 102 mmol/L (ref 98–111)
Creatinine: 1 mg/dL (ref 0.44–1.00)
GFR, Estimated: 57 mL/min — ABNORMAL LOW (ref >60)
Glucose, Bld: 107 mg/dL — ABNORMAL HIGH (ref 70–99)
Potassium: 3.9 mmol/L (ref 3.5–5.1)
Sodium: 140 mmol/L (ref 135–145)
Total Bilirubin: 0.6 mg/dL (ref 0.0–1.2)
Total Protein: 8 g/dL (ref 6.5–8.1)

## 2024-03-10 LAB — CBC WITH DIFFERENTIAL (CANCER CENTER ONLY)
Abs Immature Granulocytes: 0.01 K/uL (ref 0.00–0.07)
Basophils Absolute: 0 K/uL (ref 0.0–0.1)
Basophils Relative: 1 %
Eosinophils Absolute: 0.2 K/uL (ref 0.0–0.5)
Eosinophils Relative: 5 %
HCT: 39.6 % (ref 36.0–46.0)
Hemoglobin: 12 g/dL (ref 12.0–15.0)
Immature Granulocytes: 0 %
Lymphocytes Relative: 32 %
Lymphs Abs: 1.4 K/uL (ref 0.7–4.0)
MCH: 24.1 pg — ABNORMAL LOW (ref 26.0–34.0)
MCHC: 30.3 g/dL (ref 30.0–36.0)
MCV: 79.7 fL — ABNORMAL LOW (ref 80.0–100.0)
Monocytes Absolute: 0.6 K/uL (ref 0.1–1.0)
Monocytes Relative: 13 %
Neutro Abs: 2.1 K/uL (ref 1.7–7.7)
Neutrophils Relative %: 49 %
Platelet Count: 187 K/uL (ref 150–400)
RBC: 4.97 MIL/uL (ref 3.87–5.11)
RDW: 14.1 % (ref 11.5–15.5)
WBC Count: 4.3 K/uL (ref 4.0–10.5)
nRBC: 0 % (ref 0.0–0.2)

## 2024-03-10 LAB — LACTATE DEHYDROGENASE: LDH: 200 U/L — ABNORMAL HIGH (ref 98–192)

## 2024-03-10 NOTE — Progress Notes (Signed)
 Hematology and Oncology Follow Up Visit  Lori Rush [id number] May 13, 1946 78 y.o. 03/10/2024   Principle Diagnosis:  Stage IIIA GIST of the stomach  Current Therapy:   Gleevec  400 mg p.o. daily  --5 years -- completed in 12/ 2026     Interim History:  Lori Rush is back for follow-up.  We last saw her 6 months ago.  Since then, she has been doing okay.  She has had no vacation.  She is still working on occasion.  This been no problems with the Gleevec .  She has had no nausea or vomiting.  She has had no change in bowel bladder habits.  There has been no bleeding.  She has had no diarrhea or constipation.  There is been no leg swelling.  She has had no fever.  She is not sure when her last mammogram was.  As such, we really need to get a set up with a mammogram.  She has had no headache.  There has been no swallowing difficulties.  She has had no nausea or vomiting.  She has had no bleeding.  Currently, I would have to say that her performance status ECOG 1.     Medications:  Current Outpatient Medications:    acetaminophen  (TYLENOL ) 325 MG tablet, Take 2 tablets (650 mg total) by mouth every 6 (six) hours as needed for mild pain, fever or headache., Disp: , Rfl:    amLODipine  (NORVASC ) 10 MG tablet, TAKE 1 TABLET(10 MG) BY MOUTH DAILY, Disp: 30 tablet, Rfl: 4   atorvastatin  (LIPITOR) 20 MG tablet, Take 20 mg by mouth at bedtime., Disp: , Rfl:    Azelastine HCl 137 MCG/SPRAY SOLN, Place into the nose 2 (two) times daily as needed., Disp: , Rfl:    dorzolamide-timolol (COSOPT) 2-0.5 % ophthalmic solution, Place 2 drops into both eyes 2 (two) times daily., Disp: , Rfl:    fluticasone  (FLONASE ) 50 MCG/ACT nasal spray, Place 1 spray into both nostrils daily as needed for allergies or rhinitis. , Disp: , Rfl:    losartan  (COZAAR ) 100 MG tablet, Take 100 mg by mouth daily., Disp: , Rfl:    metoprolol  (LOPRESSOR ) 100 MG tablet, Take 100 mg by mouth 2 (two) times daily., Disp: , Rfl:     ondansetron  (ZOFRAN ) 4 MG tablet, Take 1 tablet (4 mg total) by mouth every 6 (six) hours as needed for nausea., Disp: 20 tablet, Rfl: 0   pantoprazole  (PROTONIX ) 40 MG tablet, Take 1 tablet (40 mg total) by mouth 2 (two) times daily for 14 days, THEN 1 tablet (40 mg total) daily. (Patient taking differently: Take one tablet (40mg ) once daily.), Disp: 58 tablet, Rfl: 0   triamterene -hydrochlorothiazide (MAXZIDE) 75-50 MG tablet, TAKE 1 TABLET BY MOUTH DAILY, Disp: 90 tablet, Rfl: 1   vitamin C  (ASCORBIC ACID ) 250 MG tablet, Take 1 tablet (250 mg total) by mouth 2 (two) times daily. (Patient taking differently: Take 250 mg by mouth daily.), Disp: , Rfl:    Vitamin D, Ergocalciferol, (DRISDOL) 1.25 MG (50000 UNIT) CAPS capsule, Take 50,000 Units by mouth once a week., Disp: , Rfl:   Allergies: No Known Allergies  Past Medical History, Surgical history, Social history, and Family History were reviewed and updated.  Review of Systems: Review of Systems  Constitutional: Negative.   HENT:  Negative.    Eyes: Negative.   Respiratory: Negative.    Cardiovascular: Negative.   Gastrointestinal: Negative.   Endocrine: Negative.   Genitourinary: Negative.    Musculoskeletal: Negative.  Skin: Negative.   Neurological: Negative.   Hematological: Negative.   Psychiatric/Behavioral: Negative.      Physical Exam:  weight is 150 lb (68 kg). Her oral temperature is 99 F (37.2 C). Her blood pressure is 169/83 (abnormal) and her pulse is 56 (abnormal). Her oxygen saturation is 100%.   Wt Readings from Last 3 Encounters:  03/10/24 150 lb (68 kg)  09/10/23 153 lb (69.4 kg)  08/29/21 135 lb (61.2 kg)    Physical Exam Vitals reviewed.  HENT:     Head: Normocephalic and atraumatic.  Eyes:     Pupils: Pupils are equal, round, and reactive to light.  Cardiovascular:     Rate and Rhythm: Normal rate and regular rhythm.     Heart sounds: Normal heart sounds.  Pulmonary:     Effort: Pulmonary  effort is normal.     Breath sounds: Normal breath sounds.  Abdominal:     General: Bowel sounds are normal.     Palpations: Abdomen is soft.  Musculoskeletal:        General: No tenderness or deformity. Normal range of motion.     Cervical back: Normal range of motion.  Lymphadenopathy:     Cervical: No cervical adenopathy.  Skin:    General: Skin is warm and dry.     Findings: No erythema or rash.  Neurological:     Mental Status: She is alert and oriented to person, place, and time.  Psychiatric:        Behavior: Behavior normal.        Thought Content: Thought content normal.        Judgment: Judgment normal.     Lab Results  Component Value Date   WBC 4.3 03/10/2024   HGB 12.0 03/10/2024   HCT 39.6 03/10/2024   MCV 79.7 (L) 03/10/2024   PLT 187 03/10/2024     Chemistry      Component Value Date/Time   NA 140 03/10/2024 1258   K 3.9 03/10/2024 1258   CL 102 03/10/2024 1258   CO2 27 03/10/2024 1258   BUN 13 03/10/2024 1258   CREATININE 1.00 03/10/2024 1258      Component Value Date/Time   CALCIUM  9.5 03/10/2024 1258   ALKPHOS 87 03/10/2024 1258   AST 26 03/10/2024 1258   ALT 18 03/10/2024 1258   BILITOT 0.6 03/10/2024 1258       Impression and Plan: Lori Rush is a very charming 78 year old African-American woman.  She has a locally advanced GIST.  This was resected.  She is doing well without any evidence of recurrence.  I am glad that we can still see her again.  She is very very nice.  Is always a much fun talking with her.  We will plan to get her back in 6 months.  Again, I think we can hold off on any scans for right now.  She will finish up the Gleevec  at the end of 2026.   Maude JONELLE Crease, MD 8/11/20252:21 PM

## 2024-09-08 ENCOUNTER — Inpatient Hospital Stay

## 2024-09-08 ENCOUNTER — Inpatient Hospital Stay: Admitting: Family
